# Patient Record
Sex: Female | Born: 1960 | Race: Black or African American | Hispanic: No | Marital: Single | Smoking: Never smoker
Health system: Southern US, Community
[De-identification: ages and names within clinical notes are randomized; demographics above are authoritative.]

## PROBLEM LIST (undated history)

## (undated) DIAGNOSIS — D573 Sickle-cell trait: Secondary | ICD-10-CM

## (undated) DIAGNOSIS — R42 Dizziness and giddiness: Secondary | ICD-10-CM

## (undated) DIAGNOSIS — Z972 Presence of dental prosthetic device (complete) (partial): Secondary | ICD-10-CM

## (undated) DIAGNOSIS — F32A Depression, unspecified: Secondary | ICD-10-CM

## (undated) DIAGNOSIS — M1990 Unspecified osteoarthritis, unspecified site: Secondary | ICD-10-CM

## (undated) DIAGNOSIS — J45909 Unspecified asthma, uncomplicated: Secondary | ICD-10-CM

## (undated) DIAGNOSIS — F329 Major depressive disorder, single episode, unspecified: Secondary | ICD-10-CM

## (undated) DIAGNOSIS — L409 Psoriasis, unspecified: Secondary | ICD-10-CM

## (undated) HISTORY — PX: ABDOMINAL HYSTERECTOMY: SHX81

## (undated) HISTORY — PX: COLONOSCOPY: SHX174

---

## 2008-03-15 ENCOUNTER — Ambulatory Visit: Payer: Self-pay

## 2008-04-11 ENCOUNTER — Emergency Department: Payer: Self-pay | Admitting: Emergency Medicine

## 2008-05-12 ENCOUNTER — Emergency Department: Payer: Self-pay | Admitting: Emergency Medicine

## 2008-08-01 ENCOUNTER — Emergency Department: Payer: Self-pay

## 2009-01-23 ENCOUNTER — Emergency Department: Payer: Self-pay | Admitting: Emergency Medicine

## 2009-05-03 ENCOUNTER — Inpatient Hospital Stay: Payer: Self-pay | Admitting: Psychiatry

## 2009-09-08 ENCOUNTER — Emergency Department: Payer: Self-pay | Admitting: Emergency Medicine

## 2009-10-10 ENCOUNTER — Emergency Department: Payer: Self-pay | Admitting: Emergency Medicine

## 2009-10-13 ENCOUNTER — Emergency Department: Payer: Self-pay | Admitting: Internal Medicine

## 2011-02-20 ENCOUNTER — Ambulatory Visit: Payer: Self-pay

## 2011-08-05 ENCOUNTER — Emergency Department: Payer: Self-pay | Admitting: *Deleted

## 2011-08-05 LAB — CBC
HCT: 35.2 % (ref 35.0–47.0)
HGB: 11.3 g/dL — ABNORMAL LOW (ref 12.0–16.0)
MCH: 25.9 pg — ABNORMAL LOW (ref 26.0–34.0)
Platelet: 158 10*3/uL (ref 150–440)
RBC: 4.37 10*6/uL (ref 3.80–5.20)
WBC: 5.1 10*3/uL (ref 3.6–11.0)

## 2011-08-05 LAB — URINALYSIS, COMPLETE
Bilirubin,UR: NEGATIVE
Glucose,UR: NEGATIVE mg/dL (ref 0–75)
Ketone: NEGATIVE
Nitrite: NEGATIVE
Protein: NEGATIVE
Specific Gravity: 1.015 (ref 1.003–1.030)
WBC UR: 11 /HPF (ref 0–5)

## 2011-08-05 LAB — LIPASE, BLOOD: Lipase: 155 U/L (ref 73–393)

## 2011-08-26 ENCOUNTER — Ambulatory Visit: Payer: Self-pay | Admitting: Internal Medicine

## 2011-08-26 LAB — RETICULOCYTES
Absolute Retic Count: 0.0876 10*6/uL — ABNORMAL HIGH (ref 0.024–0.084)
Reticulocyte: 1.97 % — ABNORMAL HIGH (ref 0.5–1.5)

## 2011-08-26 LAB — CBC CANCER CENTER
Bands: 1 %
Comment - H1-Com3: NORMAL
Eosinophil: 4 %
HCT: 35.5 % (ref 35.0–47.0)
MCH: 25.1 pg — ABNORMAL LOW (ref 26.0–34.0)
MCV: 80 fL (ref 80–100)
Monocytes: 7 %
Platelet: 196 x10 3/mm (ref 150–440)
RDW: 14.9 % — ABNORMAL HIGH (ref 11.5–14.5)
Segmented Neutrophils: 49 %
WBC: 3.9 x10 3/mm (ref 3.6–11.0)

## 2011-08-26 LAB — FERRITIN: Ferritin (ARMC): 7 ng/mL — ABNORMAL LOW (ref 8–388)

## 2011-08-26 LAB — IRON AND TIBC
Iron Bind.Cap.(Total): 353 ug/dL (ref 250–450)
Iron Saturation: 11 %
Iron: 39 ug/dL — ABNORMAL LOW (ref 50–170)
Unbound Iron-Bind.Cap.: 314 ug/dL

## 2011-08-26 LAB — LACTATE DEHYDROGENASE: LDH: 148 U/L (ref 84–246)

## 2011-08-26 LAB — FOLATE: Folic Acid: 6.9 ng/mL (ref 3.1–100.0)

## 2011-08-27 LAB — PROT IMMUNOELECTROPHORES(ARMC)

## 2011-09-10 LAB — OCCULT BLOOD X 1 CARD TO LAB, STOOL
Occult Blood, Feces: NEGATIVE
Occult Blood, Feces: NEGATIVE

## 2011-09-21 ENCOUNTER — Ambulatory Visit: Payer: Self-pay | Admitting: Internal Medicine

## 2011-11-04 ENCOUNTER — Ambulatory Visit: Payer: Self-pay | Admitting: Obstetrics and Gynecology

## 2011-11-04 LAB — COMPREHENSIVE METABOLIC PANEL
Albumin: 3.4 g/dL (ref 3.4–5.0)
Anion Gap: 8 (ref 7–16)
Co2: 25 mmol/L (ref 21–32)
Creatinine: 0.87 mg/dL (ref 0.60–1.30)
EGFR (African American): >60
Glucose: 93 mg/dL (ref 65–99)
Osmolality: 282 (ref 275–301)
Potassium: 3.8 mmol/L (ref 3.5–5.1)
Sodium: 142 mmol/L (ref 136–145)

## 2011-11-04 LAB — CBC
HGB: 11.1 g/dL — ABNORMAL LOW (ref 12.0–16.0)
MCHC: 32.6 g/dL (ref 32.0–36.0)
Platelet: 164 10*3/uL (ref 150–440)
RBC: 4.21 10*6/uL (ref 3.80–5.20)
RDW: 17.1 % — ABNORMAL HIGH (ref 11.5–14.5)

## 2011-11-11 ENCOUNTER — Ambulatory Visit: Payer: Self-pay | Admitting: Internal Medicine

## 2011-11-11 LAB — CBC CANCER CENTER
Basophil %: 1.1 %
Eosinophil #: 0.1 x10 3/mm (ref 0.0–0.7)
Eosinophil %: 1.9 %
HGB: 11.2 g/dL — ABNORMAL LOW (ref 12.0–16.0)
Lymphocyte %: 42.3 %
MCHC: 31.4 g/dL — ABNORMAL LOW (ref 32.0–36.0)
Monocyte #: 0.3 x10 3/mm (ref 0.2–0.9)
Neutrophil %: 46.9 %
Platelet: 148 x10 3/mm — ABNORMAL LOW (ref 150–440)
RBC: 4.38 10*6/uL (ref 3.80–5.20)
WBC: 4 x10 3/mm (ref 3.6–11.0)

## 2011-11-11 LAB — FERRITIN: Ferritin (ARMC): 11 ng/mL (ref 8–388)

## 2011-11-11 LAB — IRON AND TIBC
Iron Bind.Cap.(Total): 295 ug/dL (ref 250–450)
Iron Saturation: 21 %
Iron: 63 ug/dL (ref 50–170)

## 2011-11-15 ENCOUNTER — Ambulatory Visit: Payer: Self-pay | Admitting: Obstetrics and Gynecology

## 2011-11-15 LAB — PREGNANCY, URINE: Pregnancy Test, Urine: NEGATIVE m[IU]/mL

## 2011-11-16 LAB — HEMATOCRIT: HCT: 31.7 % — ABNORMAL LOW (ref 35.0–47.0)

## 2011-11-21 ENCOUNTER — Ambulatory Visit: Payer: Self-pay | Admitting: Internal Medicine

## 2012-01-26 IMAGING — CR DG CHEST 2V
1 series · 2 of 2 positions shown · non-contrast
Comparison: none

REASON FOR EXAM: chest pain
COMMENTS:

[Series 1: view not recorded · 0.17mm/px · 2 of 2 slices shown]
[im 1/2]
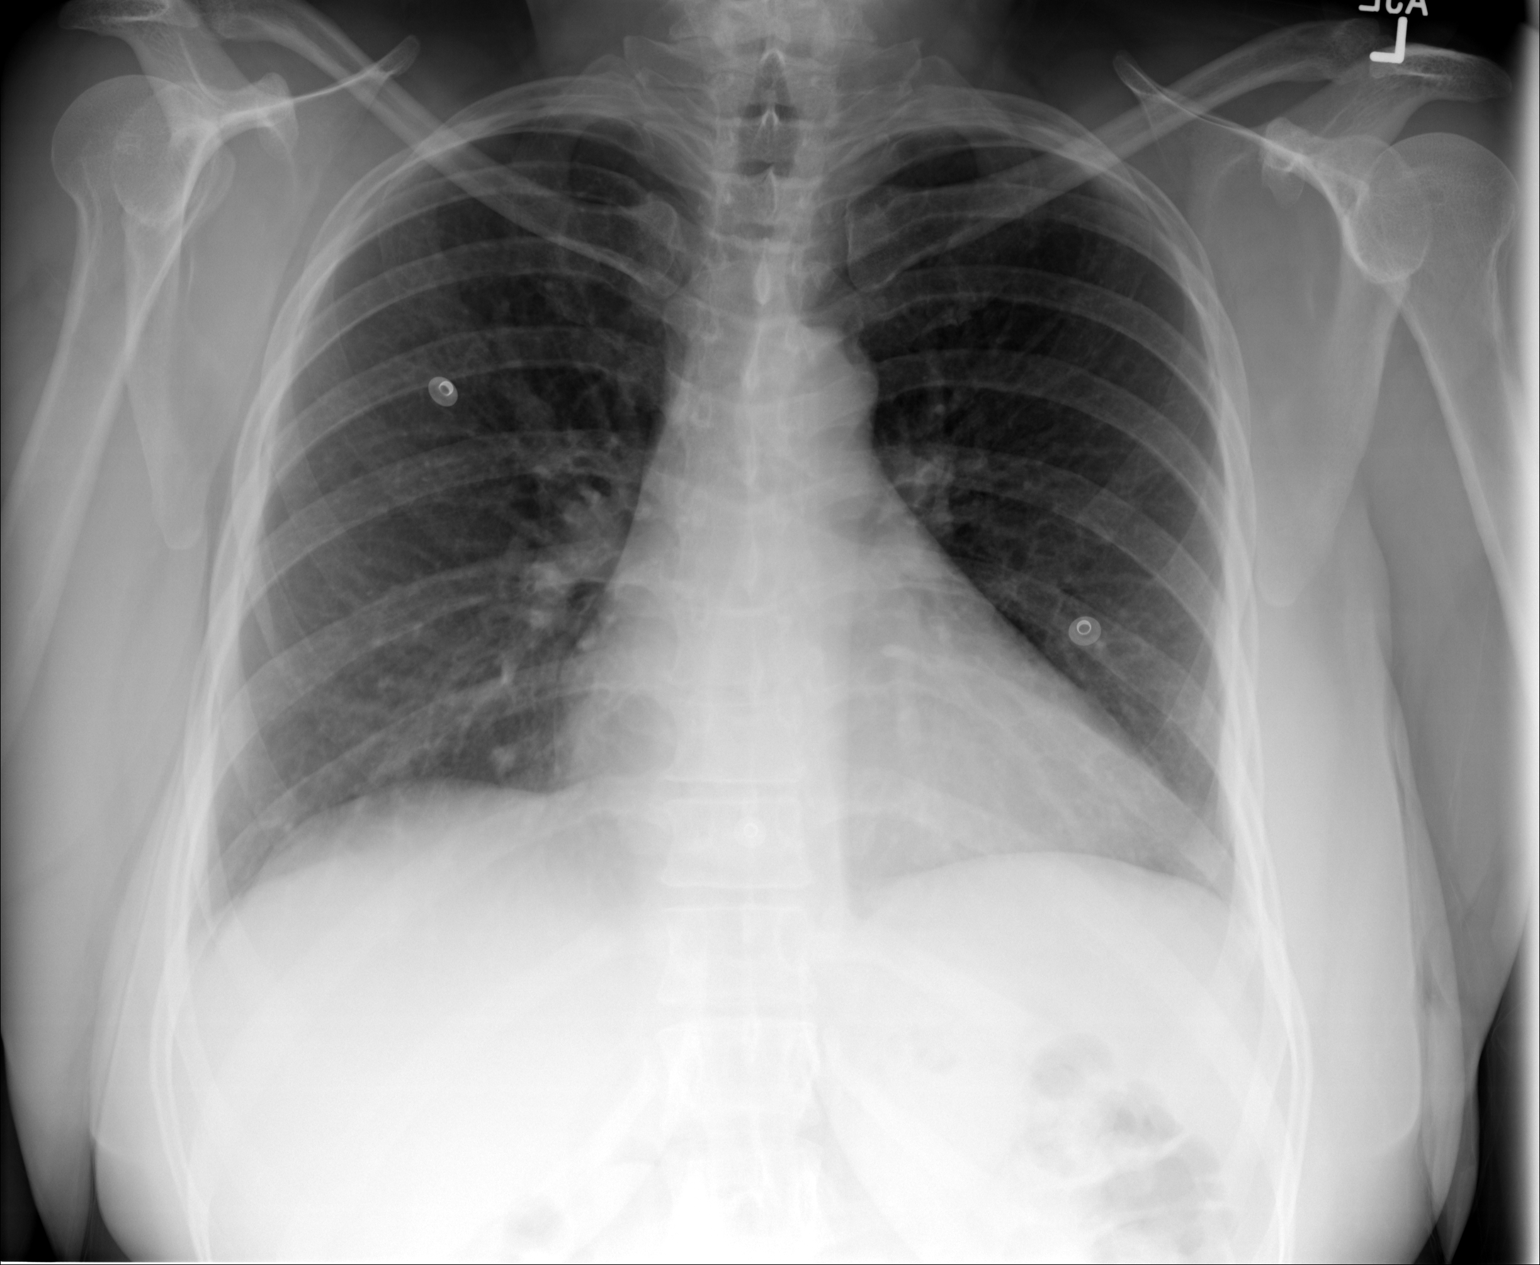
[im 2/2]
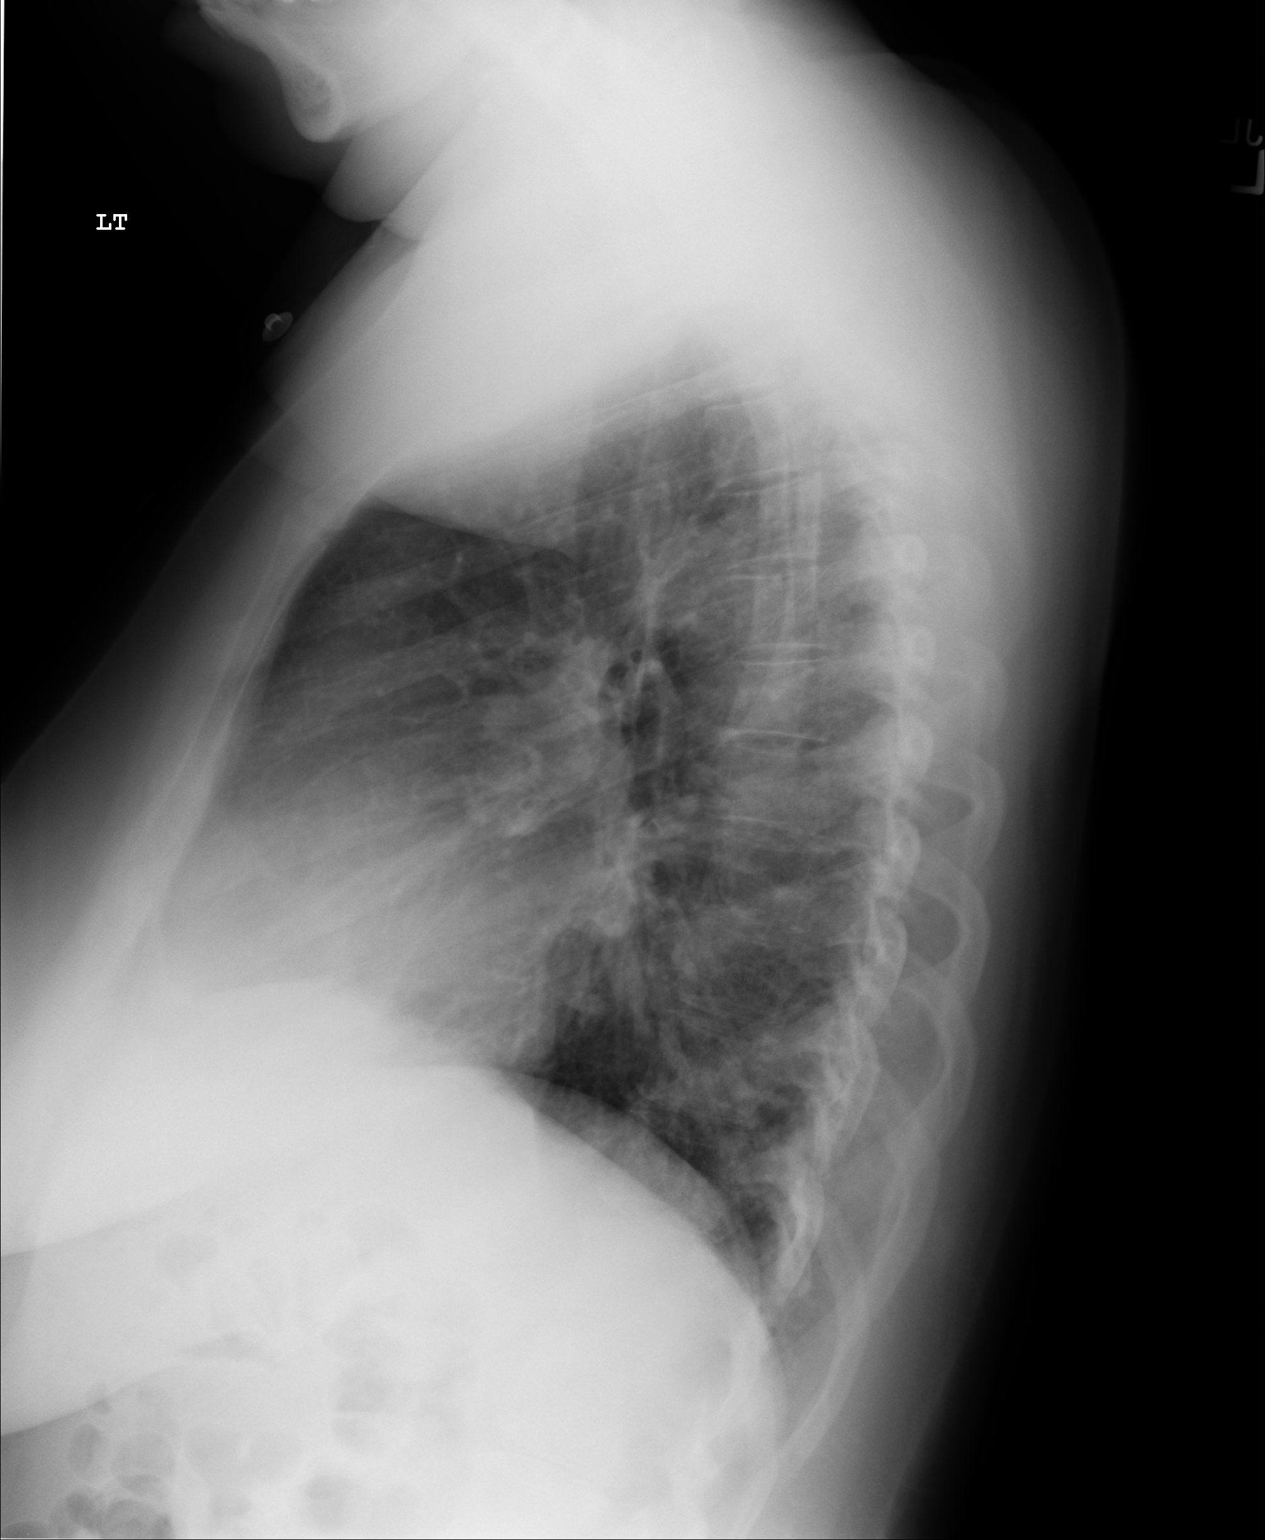

[2 of 2 positions shown; findings below may reference images not displayed]

PROCEDURE:     DXR - DXR CHEST PA (OR AP) AND LATERAL  - September 08, 2009  [DATE]

RESULT:     The lung fields are clear. No pneumonia, pneumothorax or pleural
effusion is seen. Allowing for differences in the inspiratory level, no
significant interval change seen as compared to the prior exam of 01/23/2009.
The mediastinal and structures show no significant abnormalities.
IMPRESSION: No acute changes are identified.

## 2012-02-07 ENCOUNTER — Ambulatory Visit: Payer: Self-pay | Admitting: Internal Medicine

## 2012-02-07 LAB — CBC CANCER CENTER
Basophil %: 0.8 %
Eosinophil %: 3.4 %
HCT: 36 % (ref 35.0–47.0)
HGB: 11.4 g/dL — ABNORMAL LOW (ref 12.0–16.0)
Lymphocyte #: 2 x10 3/mm (ref 1.0–3.6)
MCH: 25.9 pg — ABNORMAL LOW (ref 26.0–34.0)
MCHC: 31.8 g/dL — ABNORMAL LOW (ref 32.0–36.0)
MCV: 82 fL (ref 80–100)
Monocyte %: 8 %
Neutrophil #: 2.2 x10 3/mm (ref 1.4–6.5)
Neutrophil %: 46.5 %
Platelet: 158 x10 3/mm (ref 150–440)
RBC: 4.41 10*6/uL (ref 3.80–5.20)

## 2012-02-07 LAB — IRON AND TIBC
Iron Bind.Cap.(Total): 333 ug/dL (ref 250–450)
Iron Saturation: 25 %
Iron: 82 ug/dL (ref 50–170)
Unbound Iron-Bind.Cap.: 251 ug/dL

## 2012-02-07 LAB — FERRITIN: Ferritin (ARMC): 12 ng/mL

## 2012-02-21 ENCOUNTER — Ambulatory Visit: Payer: Self-pay | Admitting: Internal Medicine

## 2012-04-22 ENCOUNTER — Ambulatory Visit: Payer: Self-pay | Admitting: Internal Medicine

## 2012-07-11 ENCOUNTER — Emergency Department: Payer: Self-pay | Admitting: Emergency Medicine

## 2012-08-28 ENCOUNTER — Emergency Department: Payer: Self-pay | Admitting: Unknown Physician Specialty

## 2013-01-27 ENCOUNTER — Emergency Department: Payer: Self-pay | Admitting: Emergency Medicine

## 2013-01-27 LAB — URINALYSIS, COMPLETE
Bacteria: NONE SEEN
Bilirubin,UR: NEGATIVE
Glucose,UR: NEGATIVE mg/dL (ref 0–75)
Leukocyte Esterase: NEGATIVE
Nitrite: NEGATIVE
Protein: NEGATIVE
Specific Gravity: 1.017 (ref 1.003–1.030)
Squamous Epithelial: 4

## 2013-04-27 ENCOUNTER — Emergency Department: Payer: Self-pay | Admitting: Emergency Medicine

## 2013-04-27 LAB — URINALYSIS, COMPLETE
Bacteria: NONE SEEN
Bilirubin,UR: NEGATIVE
Glucose,UR: NEGATIVE mg/dL (ref 0–75)
KETONE: NEGATIVE
LEUKOCYTE ESTERASE: NEGATIVE
Nitrite: NEGATIVE
PH: 6 (ref 4.5–8.0)
PROTEIN: NEGATIVE
RBC,UR: 2 /HPF (ref 0–5)
Specific Gravity: 1.02 (ref 1.003–1.030)
WBC UR: <1 /HPF (ref 0–5)

## 2013-09-03 ENCOUNTER — Emergency Department: Payer: Self-pay | Admitting: Emergency Medicine

## 2013-09-03 LAB — CBC
HCT: 37.1 %
HGB: 12 g/dL
MCH: 26.3 pg
MCHC: 32.5 g/dL
MCV: 81 fL
Platelet: 174 x10 3/mm 3
RBC: 4.57 X10 6/mm 3
RDW: 13.7 %
WBC: 4.1 x10 3/mm 3

## 2013-09-03 LAB — BASIC METABOLIC PANEL
Anion Gap: 4 — ABNORMAL LOW (ref 7–16)
BUN: 9 mg/dL (ref 7–18)
CO2: 27 mmol/L (ref 21–32)
CREATININE: 1.2 mg/dL (ref 0.60–1.30)
Calcium, Total: 8.7 mg/dL (ref 8.5–10.1)
Chloride: 109 mmol/L — ABNORMAL HIGH (ref 98–107)
EGFR (African American): >60
GFR CALC NON AF AMER: 52 — AB
Glucose: 114 mg/dL — ABNORMAL HIGH (ref 65–99)
OSMOLALITY: 279 (ref 275–301)
Potassium: 3.8 mmol/L (ref 3.5–5.1)
Sodium: 140 mmol/L (ref 136–145)

## 2013-09-03 LAB — TROPONIN I: Troponin-I: <0.02 ng/mL

## 2013-09-06 ENCOUNTER — Emergency Department: Payer: Self-pay | Admitting: Emergency Medicine

## 2013-11-08 ENCOUNTER — Ambulatory Visit: Payer: Self-pay | Admitting: Pain Medicine

## 2013-11-08 LAB — BASIC METABOLIC PANEL
Anion Gap: 5 — ABNORMAL LOW (ref 7–16)
BUN: 10 mg/dL (ref 7–18)
CALCIUM: 8.7 mg/dL (ref 8.5–10.1)
CREATININE: 1.03 mg/dL (ref 0.60–1.30)
Chloride: 108 mmol/L — ABNORMAL HIGH (ref 98–107)
Co2: 27 mmol/L (ref 21–32)
GLUCOSE: 88 mg/dL (ref 65–99)
Osmolality: 278 (ref 275–301)
POTASSIUM: 4.2 mmol/L (ref 3.5–5.1)
SODIUM: 140 mmol/L (ref 136–145)

## 2013-11-08 LAB — HEPATIC FUNCTION PANEL A (ARMC)
ALBUMIN: 3.4 g/dL (ref 3.4–5.0)
Alkaline Phosphatase: 73 U/L
Bilirubin,Total: 0.2 mg/dL (ref 0.2–1.0)
SGOT(AST): 11 U/L — ABNORMAL LOW (ref 15–37)
SGPT (ALT): 19 U/L (ref 12–78)
TOTAL PROTEIN: 7.3 g/dL (ref 6.4–8.2)

## 2013-11-08 LAB — SEDIMENTATION RATE: Erythrocyte Sed Rate: 18 mm/hr (ref 0–30)

## 2013-11-08 LAB — MAGNESIUM: MAGNESIUM: 2 mg/dL

## 2013-11-19 ENCOUNTER — Ambulatory Visit: Payer: Self-pay | Admitting: Pain Medicine

## 2013-12-09 ENCOUNTER — Ambulatory Visit: Payer: Self-pay | Admitting: Pain Medicine

## 2014-01-10 ENCOUNTER — Ambulatory Visit: Payer: Self-pay | Admitting: Pain Medicine

## 2014-01-14 ENCOUNTER — Ambulatory Visit: Payer: Self-pay | Admitting: Unknown Physician Specialty

## 2014-01-14 HISTORY — PX: KNEE ARTHROSCOPY: SUR90

## 2014-01-24 ENCOUNTER — Ambulatory Visit: Payer: Self-pay | Admitting: Family Medicine

## 2014-01-26 ENCOUNTER — Ambulatory Visit: Payer: Self-pay | Admitting: Family Medicine

## 2014-02-07 ENCOUNTER — Ambulatory Visit: Payer: Self-pay | Admitting: Pain Medicine

## 2014-08-09 NOTE — Op Note (Signed)
PATIENT NAME:  Kathryn Pratt, Kathryn Pratt MR#:  [id number] DATE OF BIRTH:  09-03-1960  DATE OF PROCEDURE:  11/15/2011  PREOPERATIVE DIAGNOSES:  1. Abnormal uterine bleeding.  2. Leiomyoma.   POSTOPERATIVE DIAGNOSES:  1. Abnormal uterine bleeding.  2. Leiomyoma.   PROCEDURE:  1. Laparoscopic supracervical hysterectomy.  2. Bilateral salpingectomy.   SURGEON: Prentice Docker, MD   ASSISTANT: Barnett Applebaum, MD   ANESTHESIA: General.   ESTIMATED BLOOD LOSS: 100 mL.   OPERATIVE FLUIDS: 1800 mL crystalloid.   COMPLICATIONS: None.   FINDINGS:  1. Enlarged-appearing uterus.  2. Normal-appearing fallopian tubes and ovaries bilaterally.   SPECIMENS:  1. Uterus without cervix.  2. Right fallopian tube and left fallopian tube.   CONDITION: Stable.   INDICATIONS FOR PROCEDURE: Kathryn Pratt is a 54 year old female who presented to me with abnormal and heavy uterine bleeding. Evaluation revealed a fibroid uterus. After discussing the options of treatment, the patient elected to undergo a hysterectomy. She was therefore taken to the Operating Room.   PROCEDURE IN DETAIL: After patient was met in the preoperative area and her questions were answered, she was taken to the Operating Room. She was placed under general anesthesia which was found to be adequate. She was placed in the dorsal supine lithotomy position and prepped and draped in the usual sterile fashion. A sponge stick was placed in the vagina for uterine manipulation. Attention was then focused on the abdomen. After injection of local anesthetic, a 5 mm infraumbilical incision was made and the abdomen was entered via the Optiview trocar method, which is a direct entry visualization method, without incident. Entry into the abdomen was verified using opening pressure. The abdomen was insufflated with CO2. A camera was then introduced through the trocar and atraumatic entry was verified. An abdominal survey was undertaken with the above-noted findings.  A 44mm right lower quadrant port was placed under direct intra-abdominal observation. A 25mm left lower quadrant port was placed in the same manner.   Attention was turned to the right fallopian tube where the mesosalpinx was identified and transected using the Harmonic scalpel to the medial most point of the utero-ovarian ligament. From there the direction was changed to transect the utero-ovarian ligament and the round ligament. The broad ligament was then transected to the level of the internal os of the cervix, and the bladder flap was created and the bladder was moved out of the operative area of interest. The right uterine artery was skeletonized and then cauterized using bipolar electrocautery. The right uterine artery was then transected using the Harmonic. In the same fashion, the left fallopian tube and utero-ovarian ligament, round ligament, and broad ligament were transected and dissected until the left uterine artery was skeletonized, identified, cauterized with bipolar electrocautery and transected using the Harmonic scalpel. The bladder flap was created. The uterus at the level of the internal cervical os was well exposed without overlying bladder. This was then transected using a Harmonic scalpel until the uterus had been liberated from the cervix. Hemostasis was obtained in the cervix. The endocervix was then burned using the bipolar electro-cautery Kleppinger tool.   The right lower quadrant port was then extended to a 15 mm port, and the Gynecare morcellator was then introduced through that port. Under direct camera visualization, the uterus was morcellated until it was entirely removed from the abdominal cavity. The abdomen was thoroughly inspected for any remaining parts of uterus that may have then created during the morcellation process. All pieces that were found were removed.  Attention was then turned to the vascular pedicles of the hysterectomy which remained hemostatic. An adhesion  barrier was placed over the cervical stump. Under direct camera visualization, the fascia was closed using a fascial closure device on the right lower quadrant 15 mm port which occurred without incident. The abdomen was desufflated of CO2. Each incision was closed using 4-0 Monocryl in a subcuticular fashion and covered with Dermabond layer of the skin.   The patient tolerated the procedure well. Sponge, lap, and needle counts were correct x2. The patient received preoperative antibiotics in the form of Ancef 2 grams prior to skin incision. For VTE prophylaxis, she had pneumatic compression stockings in place and operating throughout the entire procedure. Additionally for VTE prophylaxis, she received 5000 units subcutaneously of heparin prior to skin incision. She was awakened in the Operating Room and transported to the recovery area in stable condition.   ____________________________ Will Bonnet, MD sdj:cbb D: 11/15/2011 14:38:02 ET T: 11/15/2011 15:06:07 ET JOB#: [id number]  cc: Will Bonnet, MD, <Dictator> Will Bonnet MD ELECTRONICALLY SIGNED 11/21/2011 72:07

## 2014-09-14 ENCOUNTER — Encounter: Payer: Self-pay | Admitting: *Deleted

## 2014-09-23 ENCOUNTER — Encounter
Admission: RE | Disposition: A | Discharge status: Home / Self Care | Payer: Self-pay | Source: Ambulatory Visit | Attending: Unknown Physician Specialty

## 2014-09-23 ENCOUNTER — Ambulatory Visit: Payer: Medicare Other | Admitting: Anesthesiology

## 2014-09-23 ENCOUNTER — Ambulatory Visit
Admission: RE | Admit: 2014-09-23 | Discharge: 2014-09-23 | Disposition: A | Discharge status: Home / Self Care | Payer: Medicare Other | Source: Ambulatory Visit | Attending: Unknown Physician Specialty | Admitting: Unknown Physician Specialty

## 2014-09-23 DIAGNOSIS — F329 Major depressive disorder, single episode, unspecified: Secondary | ICD-10-CM | POA: Diagnosis not present

## 2014-09-23 DIAGNOSIS — L409 Psoriasis, unspecified: Secondary | ICD-10-CM | POA: Insufficient documentation

## 2014-09-23 DIAGNOSIS — S83281A Other tear of lateral meniscus, current injury, right knee, initial encounter: Secondary | ICD-10-CM | POA: Insufficient documentation

## 2014-09-23 DIAGNOSIS — Z79891 Long term (current) use of opiate analgesic: Secondary | ICD-10-CM | POA: Insufficient documentation

## 2014-09-23 DIAGNOSIS — X58XXXA Exposure to other specified factors, initial encounter: Secondary | ICD-10-CM | POA: Diagnosis not present

## 2014-09-23 DIAGNOSIS — J45909 Unspecified asthma, uncomplicated: Secondary | ICD-10-CM | POA: Insufficient documentation

## 2014-09-23 DIAGNOSIS — Z79899 Other long term (current) drug therapy: Secondary | ICD-10-CM | POA: Insufficient documentation

## 2014-09-23 HISTORY — DX: Unspecified asthma, uncomplicated: J45.909

## 2014-09-23 HISTORY — DX: Presence of dental prosthetic device (complete) (partial): Z97.2

## 2014-09-23 HISTORY — DX: Dizziness and giddiness: R42

## 2014-09-23 HISTORY — DX: Major depressive disorder, single episode, unspecified: F32.9

## 2014-09-23 HISTORY — DX: Psoriasis, unspecified: L40.9

## 2014-09-23 HISTORY — PX: KNEE ARTHROSCOPY WITH LATERAL MENISECTOMY: SHX6193

## 2014-09-23 HISTORY — DX: Depression, unspecified: F32.A

## 2014-09-23 HISTORY — DX: Unspecified osteoarthritis, unspecified site: M19.90

## 2014-09-23 HISTORY — DX: Sickle-cell trait: D57.3

## 2014-09-23 SURGERY — ARTHROSCOPY, KNEE, WITH LATERAL MENISCECTOMY
Anesthesia: General | Laterality: Right | Wound class: Clean

## 2014-09-23 MED ORDER — OXYCODONE-ACETAMINOPHEN 5-325 MG PO TABS: 1.0000 | ORAL_TABLET | Freq: Four times a day (QID) | ORAL | Status: DC | PRN

## 2014-09-23 MED ORDER — ONDANSETRON HCL 4 MG/2ML IJ SOLN
INTRAMUSCULAR | Status: DC | PRN
  Administered 2014-09-23: 4 mg via INTRAVENOUS

## 2014-09-23 MED ORDER — OXYCODONE HCL 5 MG PO TABS
5.0000 mg | ORAL_TABLET | Freq: Once | ORAL | Status: AC | PRN
  Administered 2014-09-23: 5 mg via ORAL

## 2014-09-23 MED ORDER — PROPOFOL 10 MG/ML IV BOLUS
INTRAVENOUS | Status: DC | PRN
  Administered 2014-09-23: 150 mg via INTRAVENOUS

## 2014-09-23 MED ORDER — MIDAZOLAM HCL 5 MG/5ML IJ SOLN
INTRAMUSCULAR | Status: DC | PRN
  Administered 2014-09-23: 2 mg via INTRAVENOUS

## 2014-09-23 MED ORDER — ONDANSETRON HCL 4 MG/2ML IJ SOLN: 4.0000 mg | Freq: Once | INTRAMUSCULAR | Status: DC | PRN

## 2014-09-23 MED ORDER — DEXAMETHASONE SODIUM PHOSPHATE 4 MG/ML IJ SOLN
INTRAMUSCULAR | Status: DC | PRN
  Administered 2014-09-23: 4 mg via INTRAVENOUS

## 2014-09-23 MED ORDER — KETOROLAC TROMETHAMINE 30 MG/ML IJ SOLN
INTRAMUSCULAR | Status: DC | PRN
  Administered 2014-09-23: 30 mg via INTRAVENOUS

## 2014-09-23 MED ORDER — ACETAMINOPHEN 160 MG/5ML PO SOLN: 325.0000 mg | ORAL | Status: DC | PRN

## 2014-09-23 MED ORDER — LACTATED RINGERS IR SOLN
Status: DC | PRN
  Administered 2014-09-23: 3000 mL

## 2014-09-23 MED ORDER — OXYCODONE HCL 5 MG/5ML PO SOLN: 5.0000 mg | Freq: Once | ORAL | Status: AC | PRN

## 2014-09-23 MED ORDER — HYDROCODONE-ACETAMINOPHEN 7.5-325 MG PO TABS: 1.0000 | ORAL_TABLET | Freq: Once | ORAL | Status: DC | PRN

## 2014-09-23 MED ORDER — LACTATED RINGERS IV SOLN
INTRAVENOUS | Status: DC
  Administered 2014-09-23 (×3): via INTRAVENOUS

## 2014-09-23 MED ORDER — FENTANYL CITRATE (PF) 100 MCG/2ML IJ SOLN
25.0000 ug | INTRAMUSCULAR | Status: AC | PRN
  Administered 2014-09-23 (×4): 25 ug via INTRAVENOUS

## 2014-09-23 MED ORDER — LIDOCAINE HCL (CARDIAC) 20 MG/ML IV SOLN
INTRAVENOUS | Status: DC | PRN
  Administered 2014-09-23: 50 mg via INTRATRACHEAL

## 2014-09-23 MED ORDER — BUPIVACAINE HCL (PF) 0.5 % IJ SOLN
INTRAMUSCULAR | Status: DC | PRN
  Administered 2014-09-23: 20 mL

## 2014-09-23 MED ORDER — GLYCOPYRROLATE 0.2 MG/ML IJ SOLN
INTRAMUSCULAR | Status: DC | PRN
  Administered 2014-09-23: 0.2 mg via INTRAVENOUS

## 2014-09-23 MED ORDER — ACETAMINOPHEN 325 MG PO TABS: 325.0000 mg | ORAL_TABLET | ORAL | Status: DC | PRN

## 2014-09-23 MED ORDER — FENTANYL CITRATE (PF) 100 MCG/2ML IJ SOLN
INTRAMUSCULAR | Status: DC | PRN
  Administered 2014-09-23 (×4): 25 ug via INTRAVENOUS
  Administered 2014-09-23: 50 ug via INTRAVENOUS
  Administered 2014-09-23 (×2): 25 ug via INTRAVENOUS

## 2014-09-23 SURGICAL SUPPLY — 36 items
ARTHROWAND PARAGON T2 (SURGICAL WAND) ×3
BUR RADIUS 3.5 (BURR) IMPLANT
BUR RADIUS 4.0X18.5 (BURR) ×3 IMPLANT
BUR ROUND 5.5 (BURR) IMPLANT
BURR ROUND 12 FLUTE 4.0MM (BURR) ×3 IMPLANT
CUFF TOURN SGL QUICK 24 (TOURNIQUET CUFF)
CUFF TOURN SGL QUICK 30 (MISCELLANEOUS)
CUFF TOURN SGL QUICK 34 (TOURNIQUET CUFF) ×2
CUFF TRNQT CYL 24X4X40X1 (TOURNIQUET CUFF) IMPLANT
CUFF TRNQT CYL 34X4X40X1 (TOURNIQUET CUFF) ×1 IMPLANT
CUFF TRNQT CYL LO 30X4X (MISCELLANEOUS) IMPLANT
CUTTER SLOTTED WHISKER 4.0 (BURR) ×3 IMPLANT
DRAPE LEGGINS SURG 28X43 STRL (DRAPES) ×3 IMPLANT
DURAPREP 26ML APPLICATOR (WOUND CARE) ×3 IMPLANT
GAUZE SPONGE 4X4 12PLY STRL (GAUZE/BANDAGES/DRESSINGS) ×3 IMPLANT
GLOVE BIO SURGEON STRL SZ7.5 (GLOVE) ×6 IMPLANT
GLOVE BIO SURGEON STRL SZ8 (GLOVE) ×3 IMPLANT
GLOVE INDICATOR 8.0 STRL GRN (GLOVE) ×6 IMPLANT
GOWN STRL REIN 2XL XLG LVL4 (GOWN DISPOSABLE) ×3 IMPLANT
GOWN STRL REUS W/TWL 2XL LVL3 (GOWN DISPOSABLE) ×3 IMPLANT
IV LACTATED RINGER IRRG 3000ML (IV SOLUTION) ×4
IV LR IRRIG 3000ML ARTHROMATIC (IV SOLUTION) ×2 IMPLANT
MANIFOLD 4PT FOR NEPTUNE1 (MISCELLANEOUS) ×3 IMPLANT
PACK ARTHROSCOPY KNEE (MISCELLANEOUS) ×3 IMPLANT
SET TUBE SUCT SHAVER OUTFL 24K (TUBING) ×3 IMPLANT
SUT ETHILON 3-0 FS-10 30 BLK (SUTURE) ×3
SUTURE EHLN 3-0 FS-10 30 BLK (SUTURE) ×1 IMPLANT
TAPE MICROFOAM 4IN (TAPE) ×3 IMPLANT
TUBING ARTHRO INFLOW-ONLY STRL (TUBING) ×3 IMPLANT
WAND ARTHRO PARAGON T2 (SURGICAL WAND) ×1 IMPLANT
WAND COVAC 50 IFS (MISCELLANEOUS) IMPLANT
WAND HAND CNTRL MULTIVAC 50 (MISCELLANEOUS) ×3 IMPLANT
WAND HAND CNTRL MULTIVAC 90 (MISCELLANEOUS) IMPLANT
WAND MEGAVAC 90 (MISCELLANEOUS) IMPLANT
WAND ULTRAVAC 90 (MISCELLANEOUS) IMPLANT
WRAP KNEE W/COLD PACKS 25.5X14 (SOFTGOODS) ×3 IMPLANT

## 2014-09-23 NOTE — Anesthesia Procedure Notes (Signed)
Procedure Name: LMA Insertion Performed by: Keiona Jenison Pre-anesthesia Checklist: Patient identified, Emergency Drugs available, Suction available, Patient being monitored and Timeout performed Patient Re-evaluated:Patient Re-evaluated prior to inductionOxygen Delivery Method: Circle system utilized Preoxygenation: Pre-oxygenation with 100% oxygen Intubation Type: IV induction LMA: LMA inserted LMA Size: 4.0 Number of attempts: 1     

## 2014-09-23 NOTE — Discharge Instructions (Signed)
General Anesthesia, Care After °Refer to this sheet in the next few weeks. These instructions provide you with information on caring for yourself after your procedure. Your health care provider may also give you more specific instructions. Your treatment has been planned according to current medical practices, but problems sometimes occur. Call your health care provider if you have any problems or questions after your procedure. °WHAT TO EXPECT AFTER THE PROCEDURE °After the procedure, it is typical to experience: °· Sleepiness. °· Nausea and vomiting. °HOME CARE INSTRUCTIONS °· For the first 24 hours after general anesthesia: °¨ Have a responsible person with you. °¨ Do not drive a car. If you are alone, do not take public transportation. °¨ Do not drink alcohol. °¨ Do not take medicine that has not been prescribed by your health care provider. °¨ Do not sign important papers or make important decisions. °¨ You may resume a normal diet and activities as directed by your health care provider. °· Change bandages (dressings) as directed. °· If you have questions or problems that seem related to general anesthesia, call the hospital and ask for the anesthetist or anesthesiologist on call. °SEEK MEDICAL CARE IF: °· You have nausea and vomiting that continue the day after anesthesia. °· You develop a rash. °SEEK IMMEDIATE MEDICAL CARE IF:  °· You have difficulty breathing. °· You have chest pain. °· You have any allergic problems. °Document Released: 07/15/2000 Document Revised: 04/13/2013 Document Reviewed: 10/22/2012 °[employer]® Patient Information ©2015 [employer]. This information is not intended to replace advice given to you by your health care provider. Make sure you discuss any questions you have with your health care provider. ° ° °[facility]  °Harold B. [facility], Jr., M.D. [phone]  ° °KNEE ARTHROSCOPY POST OPERATION INSTRUCTIONS: ° °PLEASE READ THESE INSTRUCTIONS  ABOUT POST OPERATION CARE. THEY WILL ANSWER MOST OF YOUR QUESTIONS.  °You have been given a prescription for pain. Please take as directed for pain.  °You can walk, keeping the knee slightly stiff-avoid doing too much bending the first day. (if ACL reconstruction is performed, keep brace locked in extension when walking.)  °You will use crutches or cane if needed. Can weight bear as tolerated  °Plan to take three to four days off from work. You can resume work when you are comfortable. (This can be a week or more, depending on the type of work you do.)  °To reduce pain and swelling, place one to two pillows under the knee the first two or three days when sitting or lying. An ice pack may be placed on top of the area over the dressing. Instructions for making homemade icepack are as follow:  °Flexible homemade alcohol water ice pack  °2 cups water  °1 cup rubbing alcohol  °food coloring for the blue tint (optional)  °2 zip-top bags - gallon-size  °Mix the water and alcohol together in one of your zip-top bags and add food coloring. Release as much air as possible and seal the bag. Place in freezer for at least 12 hours.  °The small incisions in your knee are closed with nylon stitches. They will be removed in the office.  °The bulky dressing may be removed in the third day after surgery. (If ACL surgery-DO NOT REMOVE BANDAGES). Put a waterproof band-aid over each stitch. Do not put any creams or ointments on wounds. You may shower at this time, but change waterproof band-aids after showering. KEEP INCISIONS CLEAN AND DRY UNTIL YOU RETURN TO   THE OFFICE.  °Sometimes the operative area remains somewhat painful and swollen for several weeks. This is usually nothing to worry about, but call if you have any excessive symptoms, especially fever. It is not unusual to have a low grade fever of 99 degrees for the first few days. If persist after 3-4 days call the office. It is not uncommon for the pain to be a little worse on  the third day after surgery.  °Begin doing gentle exercises right away. They will be limited by the amount of pain and swelling you have.  Exercising will reduce the swelling, increase motion, and prevent muscle weakness. Exercises: Straight leg raising and gentle knee bending.  °Take 81 milligram aspirin twice a day for 2 weeks after meals or milk. This along with elevation will help reduce the possibility of phlebitis in your operated leg.  °Avoid strenuous athletics for a minimum of 4 to 6 weeks after arthroscopic surgery (approximately five months if ACL surgery).  °If the surgery included ACL reconstruction the brace that is supplied to the extremity post surgery is to be locked in extension when you are asleep and is to be locked in extension when you are ambulating. It can be unlocked for exercises or sitting.  °Keep your post surgery appointment that has been made for you. If you do not remember the date call [phone]. Your follow up appointment should be between 7-10 days.  ° °

## 2014-09-23 NOTE — Transfer of Care (Signed)
Immediate Anesthesia Transfer of Care Note  Patient: Kathryn Pratt  Procedure(s) Performed: Procedure(s) with comments: KNEE ARTHROSCOPY WITH LATERAL MENISECTOMY (Right) - lateral chondral debridement  Patient Location: PACU  Anesthesia Type: General LMA  Level of Consciousness: awake, alert  and patient cooperative  Airway and Oxygen Therapy: Patient Spontanous Breathing and Patient connected to supplemental oxygen  Post-op Assessment: Post-op Vital signs reviewed, Patient's Cardiovascular Status Stable, Respiratory Function Stable, Patent Airway and No signs of Nausea or vomiting  Post-op Vital Signs: Reviewed and stable  Complications: No apparent anesthesia complications

## 2014-09-23 NOTE — H&P (Signed)
  H and P reviewed. No changes. Uploaded at later date. 

## 2014-09-23 NOTE — Anesthesia Postprocedure Evaluation (Signed)
  Anesthesia Post-op Note  Patient: Kathryn Pratt  Procedure(s) Performed: Procedure(s) with comments: KNEE ARTHROSCOPY WITH LATERAL MENISECTOMY (Right) - lateral chondral debridement  Anesthesia type:General LMA  Patient location: PACU  Post pain: Pain level controlled  Post assessment: Post-op Vital signs reviewed, Patient's Cardiovascular Status Stable, Respiratory Function Stable, Patent Airway and No signs of Nausea or vomiting  Post vital signs: Reviewed and stable  Last Vitals:  Filed Vitals:   09/23/14 0939  BP: 135/92  Pulse: 62  Temp: 36.6 C  Resp: 16    Level of consciousness: awake, alert  and patient cooperative  Complications: No apparent anesthesia complications

## 2014-09-23 NOTE — Anesthesia Preprocedure Evaluation (Signed)
Anesthesia Evaluation  Patient identified by MRN, date of birth, ID band  Reviewed: Allergy & Precautions, H&P , NPO status , Patient's Chart, lab work & pertinent test results  Airway Mallampati: I  TM Distance: >3 FB Neck ROM: full    Dental  (+) Missing, Chipped   Pulmonary asthma ,    Pulmonary exam normal       Cardiovascular Rhythm:regular Rate:Normal     Neuro/Psych PSYCHIATRIC DISORDERS    GI/Hepatic   Endo/Other    Renal/GU      Musculoskeletal   Abdominal   Peds  Hematology   Anesthesia Other Findings   Reproductive/Obstetrics                             Anesthesia Physical Anesthesia Plan  ASA: II  Anesthesia Plan: General LMA   Post-op Pain Management:    Induction:   Airway Management Planned:   Additional Equipment:   Intra-op Plan:   Post-operative Plan:   Informed Consent: I have reviewed the patients History and Physical, chart, labs and discussed the procedure including the risks, benefits and alternatives for the proposed anesthesia with the patient or authorized representative who has indicated his/her understanding and acceptance.     Plan Discussed with: CRNA  Anesthesia Plan Comments:         Anesthesia Quick Evaluation

## 2014-09-23 NOTE — Op Note (Signed)
Preoperative diagnosis: Torn lateral meniscus right knee plus lateral compartment chondral lesions  Postop diagnosis: same  Operation: arthroscopic partial lateral meniscectomy plus lateral compartment chondral debridement  Surgeon: Randon GoldsmithKERNODLE,Thedore Pickel B, MD  Anesthesia: Gen.   History: Patient's had a long history of right knee pain.  Recently the patient had been having increasing right knee discomfort despite a previous arthroscopic partial lateral meniscectomy and microfracture of the medial femoral chondral lesion Her plain films revealed some mild lateral compartment degenerative changes.  The patient was scheduled for surgery due to persistent discomfort despite conservative treatment.  The patient was taken the operating room where satisfactory general anesthesia was achieved. A tourniquet and leg holder were was applied to the right thigh. The right lower extremity was supported with a well leg holder. The right knee was prepped and draped in usual fashion for an arthroscopic procedure. An inflow cannula was introduced superomedially. The joint was distended with lactated Ringer's. Scope was introduced through an inferolateral puncture wound and a probe through an inferomedial puncture wound. Inspection of the medial compartment revealed  a well healed medial femoral chondral lesion. This was the lesion that had been previously microfractured. Inspection of the intercondylar notch revealed normal cruciates. Inspection of the the lateral compartment revealed an anterior horn tear of the lateral meniscus plus grade 3-4 chondral changes in the lateral compartment. I performed a partial lateral meniscectomy and then debrided the grade 3 lateral femoral chondral lesion and grade 3 lateral tibial plateau chondral lesion with a turbo whisker. I then coblated the lesions with a [employer] ArthroCare wand. I then used a small wound bur to perform a chondroplasty on the small lateral tibial plateau grade 4  lesion. Trochlear groove was inspected and appeared to be fairly smooth.  Patella surface was only mildly frayed.  The instruments were removed from the joint at this time. The puncture wounds were closed with 3-0 nylon in vertical mattress fashion. I injected each puncture wound with several cc of half percent Marcaine without epinephrine. Betadine was applied the wounds followed by sterile dressing. An ice pack was applied to the right knee. The patient was awakened and transferred to her stretcher bed. She was taken to recovery room satisfactory condition  The tourniquet was not inflated during the course of the procedure. Blood loss was negligible.

## 2014-09-26 ENCOUNTER — Encounter: Payer: Self-pay | Admitting: Unknown Physician Specialty

## 2014-11-24 ENCOUNTER — Other Ambulatory Visit: Payer: Self-pay | Admitting: Unknown Physician Specialty

## 2014-11-24 DIAGNOSIS — M1711 Unilateral primary osteoarthritis, right knee: Secondary | ICD-10-CM

## 2014-11-28 ENCOUNTER — Ambulatory Visit
Admission: RE | Admit: 2014-11-28 | Discharge: 2014-11-28 | Disposition: A | Discharge status: Home / Self Care | Payer: Medicare Other | Source: Ambulatory Visit | Attending: Unknown Physician Specialty | Admitting: Unknown Physician Specialty

## 2014-11-28 DIAGNOSIS — M1711 Unilateral primary osteoarthritis, right knee: Secondary | ICD-10-CM | POA: Diagnosis present

## 2015-09-01 ENCOUNTER — Other Ambulatory Visit: Payer: Self-pay | Admitting: Family Medicine

## 2015-09-01 DIAGNOSIS — Z1231 Encounter for screening mammogram for malignant neoplasm of breast: Secondary | ICD-10-CM

## 2015-09-04 ENCOUNTER — Other Ambulatory Visit: Payer: Self-pay | Admitting: Unknown Physician Specialty

## 2015-09-04 DIAGNOSIS — M1712 Unilateral primary osteoarthritis, left knee: Secondary | ICD-10-CM

## 2015-09-08 ENCOUNTER — Ambulatory Visit
Admission: RE | Admit: 2015-09-08 | Discharge: 2015-09-08 | Disposition: A | Discharge status: Home / Self Care | Payer: Medicare Other | Source: Ambulatory Visit | Attending: Family Medicine | Admitting: Family Medicine

## 2015-09-08 ENCOUNTER — Other Ambulatory Visit: Payer: Self-pay | Admitting: Family Medicine

## 2015-09-08 DIAGNOSIS — Z1231 Encounter for screening mammogram for malignant neoplasm of breast: Secondary | ICD-10-CM

## 2015-09-22 ENCOUNTER — Ambulatory Visit
Admission: RE | Admit: 2015-09-22 | Discharge: 2015-09-22 | Disposition: A | Discharge status: Home / Self Care | Payer: Medicare Other | Source: Ambulatory Visit | Attending: Unknown Physician Specialty | Admitting: Unknown Physician Specialty

## 2015-09-22 DIAGNOSIS — S83242A Other tear of medial meniscus, current injury, left knee, initial encounter: Secondary | ICD-10-CM | POA: Diagnosis not present

## 2015-09-22 DIAGNOSIS — M25562 Pain in left knee: Secondary | ICD-10-CM | POA: Diagnosis not present

## 2015-09-22 DIAGNOSIS — M1712 Unilateral primary osteoarthritis, left knee: Secondary | ICD-10-CM

## 2015-09-22 DIAGNOSIS — M949 Disorder of cartilage, unspecified: Secondary | ICD-10-CM | POA: Diagnosis not present

## 2016-03-11 ENCOUNTER — Ambulatory Visit (INDEPENDENT_AMBULATORY_CARE_PROVIDER_SITE_OTHER): Payer: Medicare Other | Admitting: Vascular Surgery

## 2016-03-11 ENCOUNTER — Encounter (INDEPENDENT_AMBULATORY_CARE_PROVIDER_SITE_OTHER): Payer: Self-pay | Admitting: Vascular Surgery

## 2016-03-11 DIAGNOSIS — I890 Lymphedema, not elsewhere classified: Secondary | ICD-10-CM | POA: Diagnosis not present

## 2016-03-11 DIAGNOSIS — I872 Venous insufficiency (chronic) (peripheral): Secondary | ICD-10-CM

## 2016-03-11 DIAGNOSIS — M170 Bilateral primary osteoarthritis of knee: Secondary | ICD-10-CM

## 2016-03-11 DIAGNOSIS — M79609 Pain in unspecified limb: Secondary | ICD-10-CM | POA: Insufficient documentation

## 2016-03-11 DIAGNOSIS — M79605 Pain in left leg: Secondary | ICD-10-CM | POA: Diagnosis not present

## 2016-03-11 DIAGNOSIS — M1710 Unilateral primary osteoarthritis, unspecified knee: Secondary | ICD-10-CM | POA: Insufficient documentation

## 2016-03-11 DIAGNOSIS — M179 Osteoarthritis of knee, unspecified: Secondary | ICD-10-CM | POA: Insufficient documentation

## 2016-03-11 NOTE — Progress Notes (Signed)
MRN : [id number]  Kathryn Pratt is a 55 y.o. (08/27/1960) female who presents with chief complaint of  Chief Complaint  Patient presents with  . New Patient (Initial Visit)  .  History of Present Illness: Patient is seen for evaluation of leg swelling. The patient first noticed the swelling remotely but is now concerned because of a significant increase in the overall edema.  She notes the left leg has been much worse since her TKR on 01/01/2016.  Right knee had a Makoplasty partial a year ago. The swelling is associated with pain and discoloration. The patient notes that in the morning the legs are significantly improved but they steadily worsened throughout the course of the day. Elevation makes the legs better, dependency makes them much worse.   There is no history of ulcerations associated with the swelling.   The patient denies any recent changes in their medications.  The patient has not been wearing graduated compression.  The patient has no had any past angiography, interventions or vascular surgery.  The patient denies a history of DVT or PE. There is no prior history of phlebitis. There is no history of primary lymphedema.  There is no history of radiation treatment to the groin or pelvis No history of malignancies. No history of trauma or groin or pelvic surgery. No history of foreign travel or parasitic infections area    Current Meds  Medication Sig  . buPROPion (WELLBUTRIN XL) 300 MG 24 hr tablet Take 300 mg by mouth daily. AM  . carbamazepine (TEGRETOL) 200 MG tablet Take 200 mg by mouth at bedtime.  Marland Kitchen. oxycodone (OXY-IR) 5 MG capsule Take 5 mg by mouth every 8 (eight) hours as needed.  . ziprasidone (GEODON) 60 MG capsule Take 60 mg by mouth at bedtime.  Marland Kitchen. zolpidem (AMBIEN) 5 MG tablet Take 5 mg by mouth at bedtime as needed for sleep.    Past Medical History:  Diagnosis Date  . Arthritis    knee  . Asthma    no inhalers  . Depression   . Psoriasis   .  Sickle cell trait (HCC)   . Vertigo    none in 2 yrs  . Wears partial dentures    upper and lower    Past Surgical History:  Procedure Laterality Date  . ABDOMINAL HYSTERECTOMY    . COLONOSCOPY    . KNEE ARTHROSCOPY Right 01/14/14   [facility]  . KNEE ARTHROSCOPY WITH LATERAL MENISECTOMY Right 09/23/2014   Procedure: KNEE ARTHROSCOPY WITH LATERAL MENISECTOMY;  Surgeon: Erin SonsHarold Kernodle, MD;  [facility];  Service: Orthopedics;  Laterality: Right;  lateral chondral debridement    Social History Social History  Substance Use Topics  . Smoking status: Never Smoker  . Smokeless tobacco: Never Used  . Alcohol use No    Family History Family History  Problem Relation Age of Onset  . Congestive Heart Failure Mother   . Hypertension Father   . Stroke Paternal Uncle   . Breast cancer Neg Hx   No family history of bleeding/clotting disorders, porphyria or autoimmune disease   Allergies  Allergen Reactions  . Adhesive [Tape] Other (See Comments)    Some bandaids cause bruising  . Codeine Sulfate Hives     REVIEW OF SYSTEMS (Negative unless checked)  Constitutional: [] Weight loss  [] Fever  [] Chills Cardiac: [] Chest pain   [] Chest pressure   [] Palpitations   [] Shortness of breath when laying flat   [] Shortness of breath with exertion. Vascular:  [  x]Pain in legs with walking   [] Pain in legs at rest  [] History of DVT   [] Phlebitis   [x] Swelling in legs   [] Varicose veins   [] Non-healing ulcers Pulmonary:   [] Uses home oxygen   [] Productive cough   [] Hemoptysis   [] Wheeze  [] COPD   [] Asthma Neurologic:  [] Dizziness   [] Seizures   [] History of stroke   [] History of TIA  [] Aphasia   [] Vissual changes   [] Weakness or numbness in arm   [] Weakness or numbness in leg Musculoskeletal:   [x] Joint swelling   [x] Joint pain   [x] Low back pain Hematologic:  [] Easy bruising  [] Easy bleeding   [] Hypercoagulable state   [] Anemic Gastrointestinal:  [] Diarrhea   [] Vomiting   [] Gastroesophageal reflux/heartburn   [] Difficulty swallowing. Genitourinary:  [] Chronic kidney disease   [] Difficult urination  [] Frequent urination   [] Blood in urine Skin:  [] Rashes   [] Ulcers  Psychological:  [] History of anxiety   []  History of major depression.  Physical Examination  Vitals:   03/11/16 1059  BP: (!) 145/74  Pulse: (!) 58  Resp: 16  Weight: 274 lb (124.3 kg)  Height: 5\' 10"  (1.778 m)   Body mass index is 39.31 kg/m. Gen: WD/WN, NAD Head: [place]/AT, No temporalis wasting.  Ear/Nose/Throat: Hearing grossly intact, nares w/o erythema or drainage, poor dentition Eyes: PER, EOMI, sclera nonicteric.  Neck: Supple, no masses.  No bruit or JVD.  Pulmonary:  Good air movement, clear to auscultation bilaterally, no use of accessory muscles.  Cardiac: RRR, normal S1, S2, no Murmurs. Vascular: trace edema of the right ankle with 2+ edema of the left.  Incisional scars bilateral knees well healed. Vessel Right Left  Radial Palpable Palpable  Ulnar Palpable Palpable  Brachial Palpable Palpable  Carotid Palpable Palpable  Femoral Palpable Palpable  Popliteal Palpable Palpable  PT Palpable Palpable  DP Palpable Palpable   Gastrointestinal: soft, non-distended. No guarding/no peritoneal signs.  Musculoskeletal: M/S 5/5 throughout.  No deformity or atrophy.  Neurologic: CN 2-12 intact. Pain and light touch intact in extremities.  Symmetrical.  Speech is fluent. Motor exam as listed above. Psychiatric: Judgment intact, Mood & affect appropriate for pt's clinical situation. Dermatologic: No rashes or ulcers noted.  No changes consistent with cellulitis. Lymph : No Cervical lymphadenopathy, no lichenification or skin changes of chronic lymphedema.  CBC Lab Results  Component Value Date   WBC 4.1 09/03/2013   HGB 12.0 09/03/2013   HCT 37.1 09/03/2013   MCV 81 09/03/2013   PLT 174 09/03/2013    BMET    Component Value Date/Time   NA 140 11/08/2013 1633   K 4.2  11/08/2013 1633   CL 108 (H) 11/08/2013 1633   CO2 27 11/08/2013 1633   GLUCOSE 88 11/08/2013 1633   BUN 10 11/08/2013 1633   CREATININE 1.03 11/08/2013 1633   CALCIUM 8.7 11/08/2013 1633   GFRNONAA >60 11/08/2013 1633   GFRAA >60 11/08/2013 1633   CrCl cannot be calculated (Patient's most recent lab result is older than the maximum 21 days allowed.).  COAG No results found for: INR, PROTIME  Radiology No results found.   Assessment/Plan 1. Lymphedema I have had a long discussion with the patient regarding swelling and why it  causes symptoms.  Patient will begin wearing graduated compression stockings on a daily basis a prescription was given. The patient will  beginning wearing the stockings first thing in the morning and removing them in the evening. The patient is instructed specifically not to sleep  in the stockings.   In addition, behavioral modification will be initiated.  This will include frequent elevation, use of over the counter pain medications and exercise such as walking.  I have reviewed systemic causes for chronic edema such as liver, kidney and cardiac etiologies.  The patient denies problems with these organ systems.    Consideration for a lymph pump will also be made based upon the effectiveness of conservative therapy.  This would help to improve the edema control and prevent sequela such as ulcers and infections   Patient should undergo duplex ultrasound of the venous system to ensure that DVT or reflux is not present.  The patient will follow-up with me after the ultrasound.   - VAS [place] LOWER EXTREMITY VENOUS (DVT); Future  2. Chronic venous insufficiency See #1  3. Pain of left lower extremity See #1 - VAS [place] LOWER EXTREMITY VENOUS (DVT); Future  4. Primary osteoarthritis of both knees Continue NSAIDs, further treatment per Dr Nelson Chimes, MD  03/11/2016 1:07 PM

## 2016-05-13 ENCOUNTER — Encounter (INDEPENDENT_AMBULATORY_CARE_PROVIDER_SITE_OTHER): Payer: Medicare Other

## 2016-05-13 ENCOUNTER — Ambulatory Visit (INDEPENDENT_AMBULATORY_CARE_PROVIDER_SITE_OTHER): Payer: Medicare Other | Admitting: Vascular Surgery

## 2016-06-24 ENCOUNTER — Encounter (INDEPENDENT_AMBULATORY_CARE_PROVIDER_SITE_OTHER): Payer: Medicare Other

## 2016-06-24 ENCOUNTER — Ambulatory Visit (INDEPENDENT_AMBULATORY_CARE_PROVIDER_SITE_OTHER): Payer: Medicare Other | Admitting: Vascular Surgery

## 2016-08-12 ENCOUNTER — Other Ambulatory Visit: Payer: Self-pay | Admitting: Family Medicine

## 2016-11-01 ENCOUNTER — Encounter: Payer: Self-pay | Admitting: Emergency Medicine

## 2016-11-01 ENCOUNTER — Emergency Department
Admission: EM | Admit: 2016-11-01 | Discharge: 2016-11-01 | Disposition: A | Discharge status: Home / Self Care | Payer: Medicare Other | Attending: Emergency Medicine | Admitting: Emergency Medicine

## 2016-11-01 ENCOUNTER — Emergency Department: Payer: Medicare Other

## 2016-11-01 DIAGNOSIS — K5792 Diverticulitis of intestine, part unspecified, without perforation or abscess without bleeding: Secondary | ICD-10-CM

## 2016-11-01 DIAGNOSIS — K5790 Diverticulosis of intestine, part unspecified, without perforation or abscess without bleeding: Secondary | ICD-10-CM | POA: Insufficient documentation

## 2016-11-01 DIAGNOSIS — Z79899 Other long term (current) drug therapy: Secondary | ICD-10-CM | POA: Diagnosis not present

## 2016-11-01 DIAGNOSIS — R109 Unspecified abdominal pain: Secondary | ICD-10-CM | POA: Diagnosis present

## 2016-11-01 DIAGNOSIS — J45909 Unspecified asthma, uncomplicated: Secondary | ICD-10-CM | POA: Diagnosis not present

## 2016-11-01 LAB — COMPREHENSIVE METABOLIC PANEL
ALT: 17 U/L (ref 14–54)
ANION GAP: 7 (ref 5–15)
AST: 21 U/L (ref 15–41)
Albumin: 4.3 g/dL (ref 3.5–5.0)
Alkaline Phosphatase: 89 U/L (ref 38–126)
BUN: 9 mg/dL (ref 6–20)
CHLORIDE: 102 mmol/L (ref 101–111)
CO2: 30 mmol/L (ref 22–32)
Calcium: 9.7 mg/dL (ref 8.9–10.3)
Creatinine, Ser: 1.06 mg/dL — ABNORMAL HIGH (ref 0.44–1.00)
GFR, EST NON AFRICAN AMERICAN: 58 mL/min — AB (ref >60)
Glucose, Bld: 113 mg/dL — ABNORMAL HIGH (ref 65–99)
POTASSIUM: 3.6 mmol/L (ref 3.5–5.1)
Sodium: 139 mmol/L (ref 135–145)
TOTAL PROTEIN: 7.9 g/dL (ref 6.5–8.1)
Total Bilirubin: 0.4 mg/dL (ref 0.3–1.2)

## 2016-11-01 LAB — URINALYSIS, COMPLETE (UACMP) WITH MICROSCOPIC
BILIRUBIN URINE: NEGATIVE
Bacteria, UA: NONE SEEN
GLUCOSE, UA: NEGATIVE mg/dL
Ketones, ur: NEGATIVE mg/dL
Leukocytes, UA: NEGATIVE
NITRITE: NEGATIVE
Protein, ur: NEGATIVE mg/dL
SPECIFIC GRAVITY, URINE: 1.006 (ref 1.005–1.030)
pH: 7 (ref 5.0–8.0)

## 2016-11-01 LAB — CBC
HCT: 39.1 % (ref 35.0–47.0)
Hemoglobin: 13 g/dL (ref 12.0–16.0)
MCH: 26.8 pg (ref 26.0–34.0)
MCHC: 33.2 g/dL (ref 32.0–36.0)
MCV: 80.8 fL (ref 80.0–100.0)
PLATELETS: 192 10*3/uL (ref 150–440)
RBC: 4.84 MIL/uL (ref 3.80–5.20)
RDW: 14.1 % (ref 11.5–14.5)
WBC: 5.2 10*3/uL (ref 3.6–11.0)

## 2016-11-01 LAB — LIPASE, BLOOD: LIPASE: 26 U/L (ref 11–51)

## 2016-11-01 MED ORDER — METRONIDAZOLE 500 MG PO TABS
500.0000 mg | ORAL_TABLET | Freq: Three times a day (TID) | ORAL | 0 refills | Status: AC
Start: 2016-11-01 — End: 2016-11-15

## 2016-11-01 MED ORDER — IOPAMIDOL (ISOVUE-300) INJECTION 61%
30.0000 mL | Freq: Once | INTRAVENOUS | Status: AC | PRN
  Administered 2016-11-01: 30 mL via ORAL

## 2016-11-01 MED ORDER — METRONIDAZOLE 500 MG PO TABS
500.0000 mg | ORAL_TABLET | Freq: Once | ORAL | Status: AC
  Administered 2016-11-01: 500 mg via ORAL
  Filled 2016-11-01: qty 1

## 2016-11-01 MED ORDER — ONDANSETRON 4 MG PO TBDP
4.0000 mg | ORAL_TABLET | Freq: Once | ORAL | Status: AC
  Administered 2016-11-01: 4 mg via ORAL
  Filled 2016-11-01: qty 1

## 2016-11-01 MED ORDER — IOPAMIDOL (ISOVUE-300) INJECTION 61%
125.0000 mL | Freq: Once | INTRAVENOUS | Status: AC | PRN
  Administered 2016-11-01: 125 mL via INTRAVENOUS

## 2016-11-01 MED ORDER — CIPROFLOXACIN HCL 500 MG PO TABS
500.0000 mg | ORAL_TABLET | Freq: Once | ORAL | Status: AC
  Administered 2016-11-01: 500 mg via ORAL
  Filled 2016-11-01: qty 1

## 2016-11-01 MED ORDER — ONDANSETRON HCL 4 MG/2ML IJ SOLN
4.0000 mg | Freq: Once | INTRAMUSCULAR | Status: AC
  Administered 2016-11-01: 4 mg via INTRAVENOUS
  Filled 2016-11-01: qty 2

## 2016-11-01 MED ORDER — ONDANSETRON 4 MG PO TBDP: 4.0000 mg | ORAL_TABLET | Freq: Three times a day (TID) | ORAL | 0 refills | Status: DC | PRN

## 2016-11-01 MED ORDER — TRAMADOL HCL 50 MG PO TABS: 50.0000 mg | ORAL_TABLET | Freq: Four times a day (QID) | ORAL | 0 refills | Status: AC | PRN

## 2016-11-01 MED ORDER — FENTANYL CITRATE (PF) 100 MCG/2ML IJ SOLN
50.0000 ug | Freq: Once | INTRAMUSCULAR | Status: AC
  Administered 2016-11-01: 50 ug via INTRAVENOUS
  Filled 2016-11-01: qty 2

## 2016-11-01 MED ORDER — SODIUM CHLORIDE 0.9 % IV BOLUS (SEPSIS)
1000.0000 mL | Freq: Once | INTRAVENOUS | Status: AC
  Administered 2016-11-01: 1000 mL via INTRAVENOUS

## 2016-11-01 MED ORDER — CIPROFLOXACIN HCL 500 MG PO TABS: 500.0000 mg | ORAL_TABLET | Freq: Two times a day (BID) | ORAL | 0 refills | Status: AC

## 2016-11-01 NOTE — ED Provider Notes (Signed)
[facility] Emergency Department Provider Note  Time seen: 6:36 PM  I have reviewed the triage vital signs and the nursing notes.   HISTORY  Chief Complaint Flank Pain    HPI Zandrea Kenealy is a 56 y.o. female with a past medical history of asthma, depression, presents to the emergency department for left-sided abdominal pain. According to the patient since last night she's been experiencing pain in her left back radiating into her left lower quadrant. States nausea but denies any vomiting. Denies any diarrhea. Denies any black or bloody stool. Her last bowel movement was 4 days ago which is normal per patient. Denies dysuria or hematuria. Denies fever. States the pain as a moderate dull aching type pain mostly in the back with a mild pain in her left lower quadrant.  Past Medical History:  Diagnosis Date  . Arthritis    knee  . Asthma    no inhalers  . Depression   . Psoriasis   . Sickle cell trait (HCC)   . Vertigo    none in 2 yrs  . Wears partial dentures    upper and lower    Patient Active Problem List   Diagnosis Date Noted  . Lymphedema 03/11/2016  . Chronic venous insufficiency 03/11/2016  . Pain in limb 03/11/2016  . DJD (degenerative joint disease) of knee 03/11/2016    Past Surgical History:  Procedure Laterality Date  . ABDOMINAL HYSTERECTOMY    . COLONOSCOPY    . KNEE ARTHROSCOPY Right 01/14/14   [facility]  . KNEE ARTHROSCOPY WITH LATERAL MENISECTOMY Right 09/23/2014   Procedure: KNEE ARTHROSCOPY WITH LATERAL MENISECTOMY;  Surgeon: Erin Sons, MD;  Location: [facility];  Service: Orthopedics;  Laterality: Right;  lateral chondral debridement    Prior to Admission medications   Medication Sig Start Date End Date Taking? Authorizing Provider  buPROPion (WELLBUTRIN XL) 300 MG 24 hr tablet Take 300 mg by mouth daily. AM    [provider]  carbamazepine (TEGRETOL) 200 MG tablet Take 200 mg by mouth at bedtime.     [provider]  oxycodone (OXY-IR) 5 MG capsule Take 5 mg by mouth every 8 (eight) hours as needed.    [provider]  oxyCODONE-acetaminophen (ROXICET) 5-325 MG per tablet Take 1-2 tablets by mouth every 6 (six) hours as needed for severe pain. Patient not taking: Reported on 03/11/2016 09/23/14   Erin Sons, MD  oxyCODONE-acetaminophen (ROXICET) 5-325 MG per tablet Take 1-2 tablets by mouth every 6 (six) hours as needed for severe pain. Patient not taking: Reported on 03/11/2016 09/23/14   Erin Sons, MD  ziprasidone (GEODON) 60 MG capsule Take 60 mg by mouth at bedtime.    [provider]  zolpidem (AMBIEN) 5 MG tablet Take 5 mg by mouth at bedtime as needed for sleep.    [provider]    Allergies  Allergen Reactions  . Adhesive [Tape] Other (See Comments)    Some bandaids cause bruising  . Codeine Sulfate Hives    Family History  Problem Relation Age of Onset  . Congestive Heart Failure Mother   . Hypertension Father   . Stroke Paternal Uncle   . Breast cancer Neg Hx     Social History Social History  Substance Use Topics  . Smoking status: Never Smoker  . Smokeless tobacco: Never Used  . Alcohol use No    Review of Systems Constitutional: Negative for fever Cardiovascular: Negative for chest pain. Respiratory: Negative for  shortness of breath. Gastrointestinal: Left back and left lower quadrant pain. Negative for vomiting or diarrhea. Positive for nausea. Genitourinary: Negative for dysuria. Musculoskeletal: Left back pain Neurological: Negative for headache All other ROS negative  ____________________________________________   PHYSICAL EXAM:  VITAL SIGNS: ED Triage Vitals  Enc Vitals Group     BP 11/01/16 1641 139/75     Pulse Rate 11/01/16 1641 72     Resp 11/01/16 1641 20     Temp 11/01/16 1641 98.3 F (36.8 C)     Temp Source 11/01/16 1641 Oral     SpO2 11/01/16 1641 98 %     Weight 11/01/16 1643 293  lb (132.9 kg)     Height 11/01/16 1643 5\' 10"  (1.778 m)     Head Circumference --      Peak Flow --      Pain Score 11/01/16 1647 8     Pain Loc --      Pain Edu? --      Excl. in GC? --     Constitutional: Alert and oriented. Well appearing and in no distress. Eyes: Normal exam ENT   Head: Normocephalic and atraumatic   Mouth/Throat: Mucous membranes are moist. Cardiovascular: Normal rate, regular rhythm. No murmur Respiratory: Normal respiratory effort without tachypnea nor retractions. Breath sounds are clear  Gastrointestinal: Soft, mild left lower quadrant tenderness palpation, moderate left CVA tenderness. Abdomen otherwise benign. No rebound or guarding. No distention. Musculoskeletal: Nontender with normal range of motion in all extremities. Neurologic:  Normal speech and language. No gross focal neurologic deficits  Skin:  Skin is warm, dry and intact.  Psychiatric: Mood and affect are normal.   ____________________________________________    RADIOLOGY  CT consistent with acute diverticulitis.  ____________________________________________   INITIAL IMPRESSION / ASSESSMENT AND PLAN / ED COURSE  Pertinent labs & imaging results that were available during my care of the patient were reviewed by me and considered in my medical decision making (see chart for details).  [facility] department for left-sided abdominal pain and back pain since last night with nausea. Moderate to mild tenderness on exam. We will check labs obtain a CT scan of the abdomen/pelvis further evaluate. We will treat pain and nausea, IV hydrate while awaiting results. Patient agreeable to this plan.  CT consistent with acute diverticulitis per labs are largely within normal limits. We will discharge with ciprofloxacin, Flagyl and pain medication with GI follow-up. I discussed return precautions.  ____________________________________________   FINAL CLINICAL IMPRESSION(S) /  ED DIAGNOSES  Left abdominal pain Diverticulitis   Minna AntisPaduchowski, Leahanna Buser, MD 11/01/16 2029

## 2016-11-01 NOTE — ED Triage Notes (Signed)
Pt in via POV with complaints of increasing left flank pain and nausea since yesterday.  Pt denies any urinary symptoms, pt ambulatory to triage, NAD noted at this time.

## 2016-11-01 NOTE — ED Notes (Signed)
Patient given cup of ice per patient's request and with Dr. Serita GrammesPaduchowki's approval.

## 2016-12-04 ENCOUNTER — Other Ambulatory Visit: Payer: Self-pay | Admitting: Nurse Practitioner

## 2016-12-04 DIAGNOSIS — Z1231 Encounter for screening mammogram for malignant neoplasm of breast: Secondary | ICD-10-CM

## 2016-12-27 ENCOUNTER — Ambulatory Visit
Admission: RE | Admit: 2016-12-27 | Discharge: 2016-12-27 | Disposition: A | Discharge status: Home / Self Care | Payer: Medicare Other | Source: Ambulatory Visit | Attending: Nurse Practitioner | Admitting: Nurse Practitioner

## 2016-12-27 DIAGNOSIS — Z1231 Encounter for screening mammogram for malignant neoplasm of breast: Secondary | ICD-10-CM | POA: Insufficient documentation

## 2017-12-10 ENCOUNTER — Other Ambulatory Visit: Payer: Self-pay | Admitting: Nurse Practitioner

## 2017-12-10 DIAGNOSIS — Z1231 Encounter for screening mammogram for malignant neoplasm of breast: Secondary | ICD-10-CM

## 2018-01-23 ENCOUNTER — Ambulatory Visit
Admission: RE | Admit: 2018-01-23 | Discharge: 2018-01-23 | Disposition: A | Discharge status: Home / Self Care | Payer: Medicare Other | Source: Ambulatory Visit | Attending: Nurse Practitioner | Admitting: Nurse Practitioner

## 2018-01-23 DIAGNOSIS — Z1231 Encounter for screening mammogram for malignant neoplasm of breast: Secondary | ICD-10-CM | POA: Diagnosis present

## 2018-10-17 ENCOUNTER — Emergency Department
Admission: EM | Admit: 2018-10-17 | Discharge: 2018-10-18 | Disposition: A | Discharge status: Home / Self Care | Payer: Medicare Other | Attending: Emergency Medicine | Admitting: Emergency Medicine

## 2018-10-17 ENCOUNTER — Emergency Department: Payer: Medicare Other

## 2018-10-17 ENCOUNTER — Other Ambulatory Visit: Payer: Self-pay

## 2018-10-17 ENCOUNTER — Encounter: Payer: Self-pay | Admitting: Emergency Medicine

## 2018-10-17 DIAGNOSIS — M62830 Muscle spasm of back: Secondary | ICD-10-CM | POA: Diagnosis not present

## 2018-10-17 DIAGNOSIS — M549 Dorsalgia, unspecified: Secondary | ICD-10-CM | POA: Diagnosis present

## 2018-10-17 DIAGNOSIS — Z79899 Other long term (current) drug therapy: Secondary | ICD-10-CM | POA: Diagnosis not present

## 2018-10-17 DIAGNOSIS — J45909 Unspecified asthma, uncomplicated: Secondary | ICD-10-CM | POA: Diagnosis not present

## 2018-10-17 DIAGNOSIS — M546 Pain in thoracic spine: Secondary | ICD-10-CM | POA: Insufficient documentation

## 2018-10-17 LAB — BASIC METABOLIC PANEL
Anion gap: 10 (ref 5–15)
BUN: 14 mg/dL (ref 6–20)
CO2: 24 mmol/L (ref 22–32)
Calcium: 9.3 mg/dL (ref 8.9–10.3)
Chloride: 106 mmol/L (ref 98–111)
Creatinine, Ser: 1.06 mg/dL — ABNORMAL HIGH (ref 0.44–1.00)
GFR calc Af Amer: >60 mL/min (ref >60)
GFR calc non Af Amer: 59 mL/min — ABNORMAL LOW (ref >60)
Glucose, Bld: 98 mg/dL (ref 70–99)
Potassium: 3.7 mmol/L (ref 3.5–5.1)
Sodium: 140 mmol/L (ref 135–145)

## 2018-10-17 LAB — CBC
HCT: 37.8 % (ref 36.0–46.0)
Hemoglobin: 12.4 g/dL (ref 12.0–15.0)
MCH: 26.7 pg (ref 26.0–34.0)
MCHC: 32.8 g/dL (ref 30.0–36.0)
MCV: 81.5 fL (ref 80.0–100.0)
Platelets: 194 10*3/uL (ref 150–400)
RBC: 4.64 MIL/uL (ref 3.87–5.11)
RDW: 13.7 % (ref 11.5–15.5)
WBC: 5.6 10*3/uL (ref 4.0–10.5)
nRBC: 0 % (ref 0.0–0.2)

## 2018-10-17 LAB — TROPONIN I (HIGH SENSITIVITY): Troponin I (High Sensitivity): <2 ng/L (ref <18)

## 2018-10-17 NOTE — ED Triage Notes (Signed)
Pt to ED from home c/o upper mid back pain that is sharp and radiating to left shoulder.  Denies SOB, denies n/v/d, denies significant cardiac hx.  States pain started about 3hrs PTA and was lying in bed, pain is worse with movement.  Denies known injury.  Chest rise even and unlabored, skin warm and dry, in NAD at this time.

## 2018-10-18 DIAGNOSIS — M62830 Muscle spasm of back: Secondary | ICD-10-CM | POA: Diagnosis not present

## 2018-10-18 MED ORDER — IBUPROFEN 800 MG PO TABS: 800.0000 mg | ORAL_TABLET | Freq: Three times a day (TID) | ORAL | 0 refills | Status: DC | PRN

## 2018-10-18 MED ORDER — OXYCODONE-ACETAMINOPHEN 5-325 MG PO TABS
1.0000 | ORAL_TABLET | Freq: Once | ORAL | Status: AC
  Administered 2018-10-18: 1 via ORAL
  Filled 2018-10-18: qty 1

## 2018-10-18 MED ORDER — CYCLOBENZAPRINE HCL 5 MG PO TABS: 5.0000 mg | ORAL_TABLET | Freq: Three times a day (TID) | ORAL | 0 refills | Status: DC | PRN

## 2018-10-18 MED ORDER — KETOROLAC TROMETHAMINE 30 MG/ML IJ SOLN
30.0000 mg | Freq: Once | INTRAMUSCULAR | Status: AC
  Administered 2018-10-18: 30 mg via INTRAMUSCULAR
  Filled 2018-10-18: qty 1

## 2018-10-18 MED ORDER — CYCLOBENZAPRINE HCL 10 MG PO TABS
5.0000 mg | ORAL_TABLET | Freq: Once | ORAL | Status: AC
  Administered 2018-10-18: 5 mg via ORAL
  Filled 2018-10-18: qty 1

## 2018-10-18 MED ORDER — OXYCODONE-ACETAMINOPHEN 5-325 MG PO TABS: 1.0000 | ORAL_TABLET | ORAL | 0 refills | Status: DC | PRN

## 2018-10-18 NOTE — Discharge Instructions (Addendum)
1.  You may take medicines as needed for pain and muscle spasms (Motrin/Percocet/Flexeril #15). 2.  Apply moist heat to affected area several times daily. 3.  Return to the ER for worsening symptoms, persistent vomiting, difficulty breathing or other concerns. 

## 2018-10-18 NOTE — ED Provider Notes (Signed)
[facility] Emergency Department Provider Note   ____________________________________________   First MD Initiated Contact with Patient 10/18/18 0101     (approximate)  I have reviewed the triage vital signs and the nursing notes.   HISTORY  Chief Complaint Back Pain    HPI Kathryn Pratt is a 58 y.o. female who presents to the ED from home with a chief complaint of upper back pain.  Patient was at rest approximately 7:30 PM when she noted pain to her left shoulder blade.  Pain exacerbated with movement and states she feels muscle spasms.  Similar symptoms previously.  Denies recent fall/injury/trauma.  Denies associated chest pain, radiation, shortness of breath, nausea/vomiting, palpitations or dizziness.  Denies recent travel, hormone use or exposure to persons diagnosed with coronavirus.       Past Medical History:  Diagnosis Date   Arthritis    knee   Asthma    no inhalers   Depression    Psoriasis    Sickle cell trait (HCC)    Vertigo    none in 2 yrs   Wears partial dentures    upper and lower    Patient Active Problem List   Diagnosis Date Noted   Lymphedema 03/11/2016   Chronic venous insufficiency 03/11/2016   Pain in limb 03/11/2016   DJD (degenerative joint disease) of knee 03/11/2016    Past Surgical History:  Procedure Laterality Date   ABDOMINAL HYSTERECTOMY     COLONOSCOPY     KNEE ARTHROSCOPY Right 01/14/14   [facility]   KNEE ARTHROSCOPY WITH LATERAL MENISECTOMY Right 09/23/2014   Procedure: KNEE ARTHROSCOPY WITH LATERAL MENISECTOMY;  Surgeon: Erin SonsHarold Kernodle, MD;  Location: [facility];  Service: Orthopedics;  Laterality: Right;  lateral chondral debridement    Prior to Admission medications   Medication Sig Start Date End Date Taking? Authorizing Provider  buPROPion (WELLBUTRIN XL) 300 MG 24 hr tablet Take 300 mg by mouth daily. AM    [provider]  carbamazepine (TEGRETOL) 200 MG tablet  Take 200 mg by mouth at bedtime.    [provider]  cyclobenzaprine (FLEXERIL) 5 MG tablet Take 1 tablet (5 mg total) by mouth 3 (three) times daily as needed for muscle spasms. 10/18/18   Irean HongSung, Alicja Everitt J, MD  ibuprofen (ADVIL) 800 MG tablet Take 1 tablet (800 mg total) by mouth every 8 (eight) hours as needed for moderate pain. 10/18/18   Irean HongSung, Cypress Fanfan J, MD  ondansetron (ZOFRAN ODT) 4 MG disintegrating tablet Take 1 tablet (4 mg total) by mouth every 8 (eight) hours as needed for nausea or vomiting. 11/01/16   Minna AntisPaduchowski, Kevin, MD  oxycodone (OXY-IR) 5 MG capsule Take 5 mg by mouth every 8 (eight) hours as needed.    [provider]  oxyCODONE-acetaminophen (PERCOCET/ROXICET) 5-325 MG tablet Take 1 tablet by mouth every 4 (four) hours as needed for severe pain. 10/18/18   Irean HongSung, Mical Brun J, MD  ziprasidone (GEODON) 60 MG capsule Take 60 mg by mouth at bedtime.    [provider]  zolpidem (AMBIEN) 5 MG tablet Take 5 mg by mouth at bedtime as needed for sleep.    [provider]    Allergies Adhesive [tape] and Codeine sulfate  Family History  Problem Relation Age of Onset   Congestive Heart Failure Mother    Hypertension Father    Stroke Paternal Uncle    Breast cancer Neg Hx     Social History Social History   Tobacco Use  Smoking status: Never Smoker   Smokeless tobacco: Never Used  Substance Use Topics   Alcohol use: No   Drug use: No    Review of Systems  Constitutional: No fever/chills Eyes: No visual changes. ENT: No sore throat. Cardiovascular: Denies chest pain. Respiratory: Denies shortness of breath. Gastrointestinal: No abdominal pain.  No nausea, no vomiting.  No diarrhea.  No constipation. Genitourinary: Negative for dysuria. Musculoskeletal: Positive for back pain. Skin: Negative for rash. Neurological: Negative for headaches, focal weakness or numbness.   ____________________________________________   PHYSICAL  EXAM:  VITAL SIGNS: ED Triage Vitals [10/17/18 2044]  Enc Vitals Group     BP (!) 141/110     Pulse Rate 94     Resp 18     Temp 98.7 F (37.1 C)     Temp Source Oral     SpO2 97 %     Weight (!) 310 lb (140.6 kg)     Height 5\' 10"  (1.778 m)     Head Circumference      Peak Flow      Pain Score 10     Pain Loc      Pain Edu?      Excl. in GC?     Constitutional: Alert and oriented. Well appearing and in no acute distress. Eyes: Conjunctivae are normal. PERRL. EOMI. Head: Atraumatic. Nose: No congestion/rhinnorhea. Mouth/Throat: Mucous membranes are moist.  Oropharynx non-erythematous. Neck: No stridor.  No cervical spine tenderness to palpation. Cardiovascular: Normal rate, regular rhythm. Grossly normal heart sounds.  Good peripheral circulation. Respiratory: Normal respiratory effort.  No retractions. Lungs CTAB. Gastrointestinal: Soft and nontender. No distention. No abdominal bruits. No CVA tenderness. Musculoskeletal: Left trapezius muscle spasms.  Left upper thoracic area tender to palpation.  Limited range of motion left shoulder secondary to left upper back pain.  2+ radial pulses.  Brisk, less than 5-second capillary refill. No lower extremity tenderness nor edema.  No joint effusions. Neurologic:  Normal speech and language. No gross focal neurologic deficits are appreciated. No gait instability. Skin:  Skin is warm, dry and intact. No rash noted. Psychiatric: Mood and affect are normal. Speech and behavior are normal.  ____________________________________________   LABS (all labs ordered are listed, but only abnormal results are displayed)  Labs Reviewed  BASIC METABOLIC PANEL - Abnormal; Notable for the following components:      Result Value   Creatinine, Ser 1.06 (*)    GFR calc non Af Amer 59 (*)    All other components within normal limits  CBC  TROPONIN I (HIGH SENSITIVITY)  TROPONIN I (HIGH SENSITIVITY)  POC URINE PREG, ED    ____________________________________________  EKG  ED ECG REPORT I, Azhar Yogi J, the attending physician, personally viewed and interpreted this ECG.   Date: 10/18/2018  EKG Time: 2050  Rate: 80  Rhythm: normal EKG, normal sinus rhythm  Axis: Normal  Intervals:none  ST&T Change: Nonspecific  ____________________________________________  RADIOLOGY  ED MD interpretation: No acute cardiopulmonary process  Official radiology report(s): Dg Chest 2 View  Result Date: 10/17/2018 CLINICAL DATA:  Chest pain radiating to left shoulder for 3 hours. EXAM: CHEST - 2 VIEW COMPARISON:  09/03/2013 FINDINGS: The heart size and mediastinal contours are within normal limits. Low lung volumes are noted. Both lungs are clear. The visualized skeletal structures are unremarkable. IMPRESSION: Low lung volumes.  No active disease. Electronically Signed   By: Danae OrleansJohn A Stahl M.D.   On: 10/17/2018 21:34    ____________________________________________  PROCEDURES  Procedure(s) performed (including Critical Care):  Procedures   ____________________________________________   INITIAL IMPRESSION / ASSESSMENT AND PLAN / ED COURSE  As part of my medical decision making, I reviewed the following data within the [facility] notes reviewed and incorporated, Labs reviewed, EKG interpreted, Old chart reviewed, Radiograph reviewed and Notes from prior ED visits     Kathryn Pratt was evaluated in Emergency Department on 10/18/2018 for the symptoms described in the history of present illness. She was evaluated in the context of the global COVID-19 pandemic, which necessitated consideration that the patient might be at risk for infection with the SARS-CoV-2 virus that causes COVID-19. Institutional protocols and algorithms that pertain to the evaluation of patients at risk for COVID-19 are in a state of rapid change based on information released by regulatory bodies including the CDC and  federal and state organizations. These policies and algorithms were followed during the patient's care in the ED.   58 year old female who presents with reproducible left upper back pain.  Diagnosis includes but is not limited to musculoskeletal, cardiac, metabolic, neurologic etiologies, etc.  Laboratory results including troponin unremarkable.  Clinically cause is clearly musculoskeletal in nature and do not feel repeat troponin is warranted.  Will administer NSAIDs, analgesia and muscle relaxer.  Clinical Course as of Oct 17 432  Sun Oct 18, 2018  0236 Patient feeling significantly better.  Strict return precautions given.  Patient verbalizes understanding agrees with plan of care.   [JS]    Clinical Course User Index [JS] Paulette Blanch, MD     ____________________________________________   FINAL CLINICAL IMPRESSION(S) / ED DIAGNOSES  Final diagnoses:  Muscle spasm of back  Acute left-sided thoracic back pain     ED Discharge Orders         Ordered    ibuprofen (ADVIL) 800 MG tablet  Every 8 hours PRN     10/18/18 0152    oxyCODONE-acetaminophen (PERCOCET/ROXICET) 5-325 MG tablet  Every 4 hours PRN     10/18/18 0152    cyclobenzaprine (FLEXERIL) 5 MG tablet  3 times daily PRN     10/18/18 0152           Note:  This document was prepared using Dragon voice recognition software and may include unintentional dictation errors.   Paulette Blanch, MD 10/18/18 [phone]

## 2018-10-18 NOTE — ED Notes (Signed)
See triage note. Pt c/o L upper back pain. Denies SOB/CP/jaw or shoulder pain or numbness. Pt states history of HTN and states took her BP meds today. Pt denies cardiac history but states mother had extensive cardiac history. Pt states she has experienced this type of pain a few times before.

## 2019-03-21 IMAGING — CT CT ABD-PELV W/ CM
2 of 5 series · 15 of 46 positions shown, 17 images · IV contrast (APPLIED)
Comparison: CT 10/10/2009

CLINICAL DATA: Left lower quadrant and left flank pain. Nausea.
Progressive symptoms since yesterday.

EXAM:
CT ABDOMEN AND PELVIS WITH CONTRAST
TECHNIQUE: Multidetector CT imaging of the abdomen and pelvis was performed
using the standard protocol following bolus administration of
intravenous contrast.
CONTRAST:  125mL ZLNVCS-O22 IOPAMIDOL (ZLNVCS-O22) INJECTION 61%

[Series 2: routine abd/pel with · axial · 0.78mm/px · z∈[-1231,-726]mm · 12 of 113 slices shown, 14 images]
[im 6/113  soft-tissue]
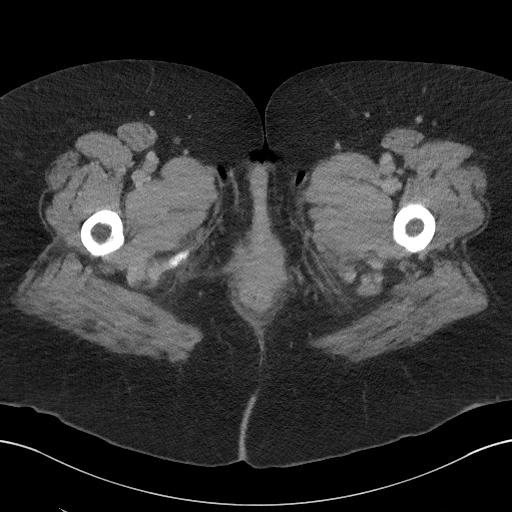
[im 6/113  bone]
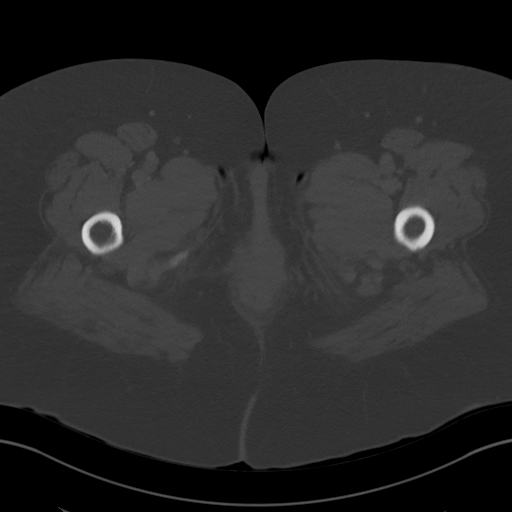
[im 17/113  soft-tissue]
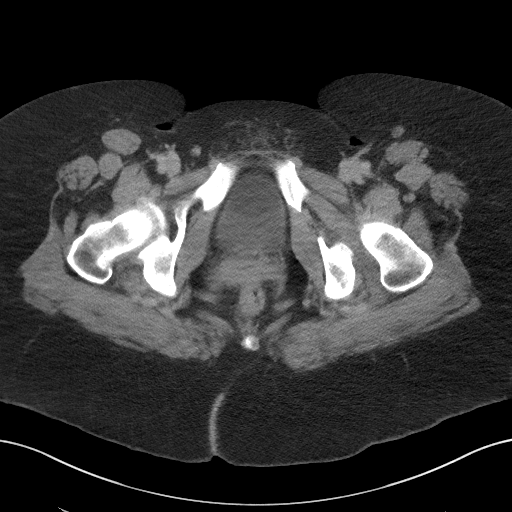
[im 23/113  soft-tissue]
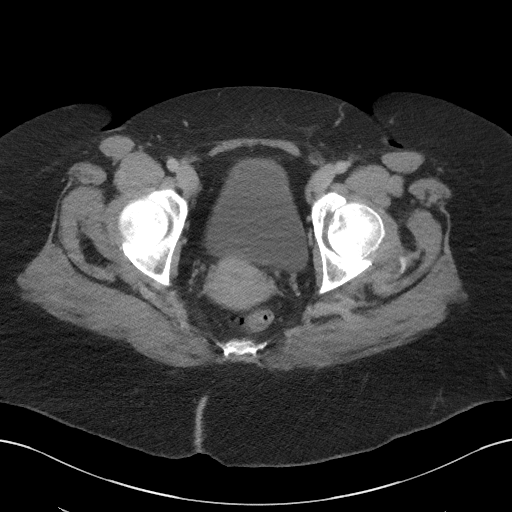
[im 34/113  soft-tissue]
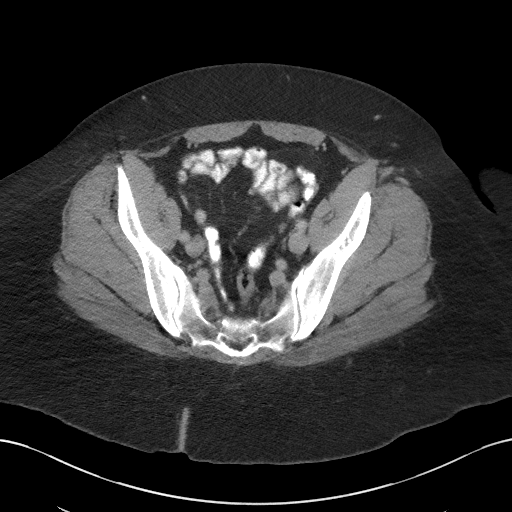
[im 45/113  soft-tissue]
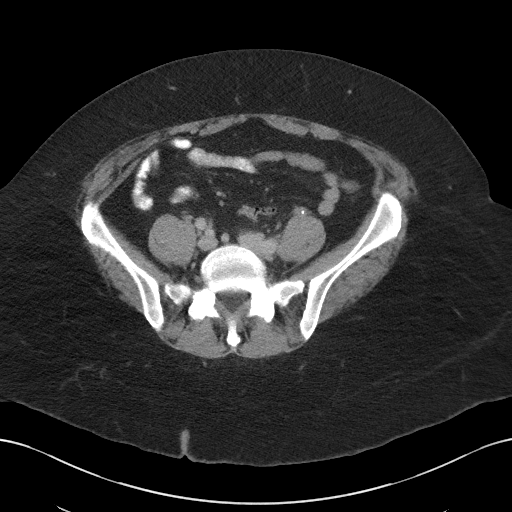
[im 51/113  soft-tissue]
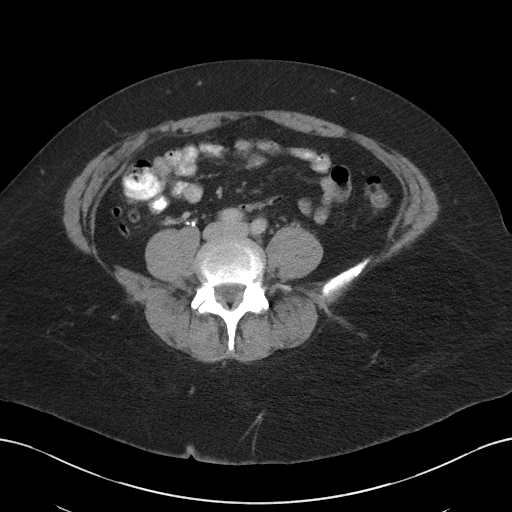
[im 62/113  soft-tissue]
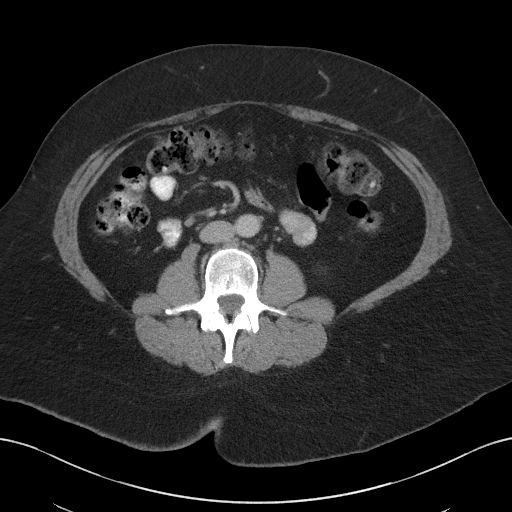
[im 68/113  soft-tissue]
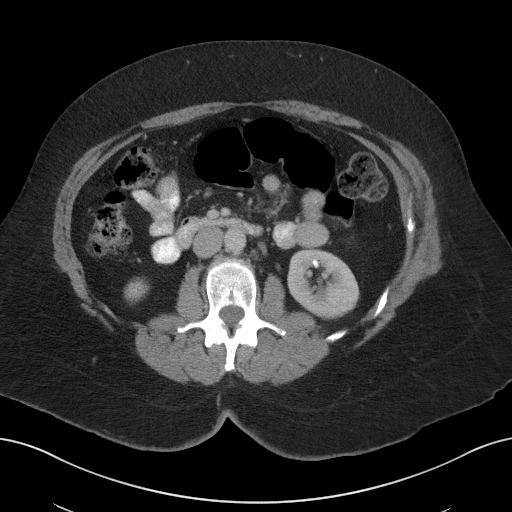
[im 79/113  soft-tissue]
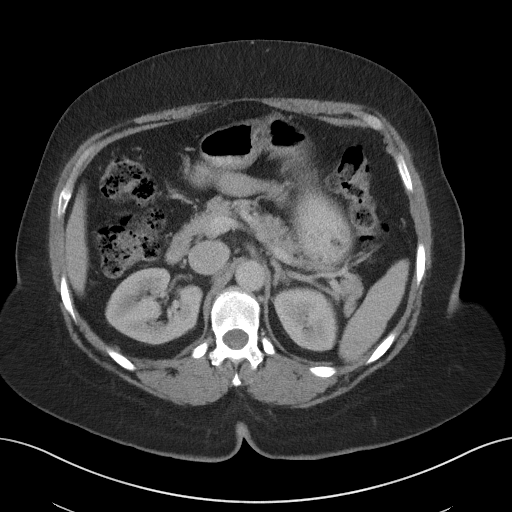
[im 79/113  bone]
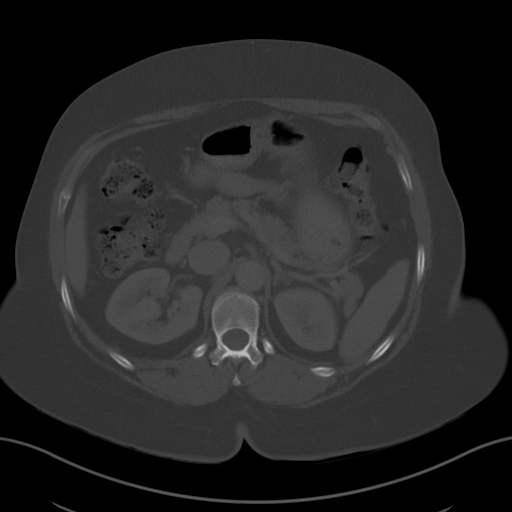
[im 90/113  soft-tissue]
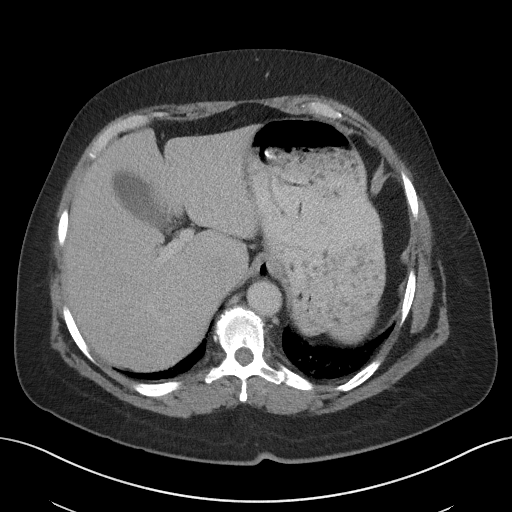
[im 96/113  soft-tissue]
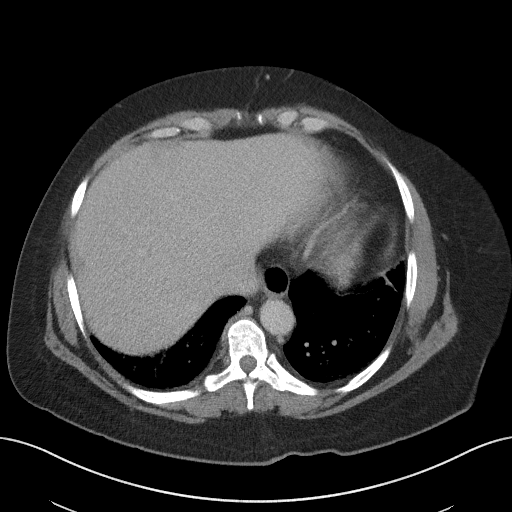
[im 107/113  soft-tissue]
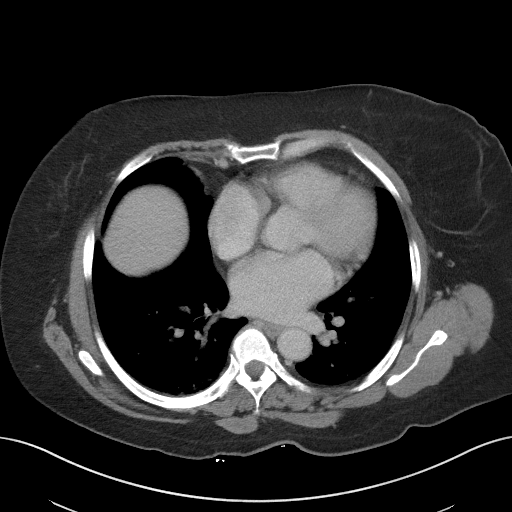

[Series 5: coronal st · coronal · 0.79mm/px · 3 of 96 slices shown]
[im 32/96  soft-tissue]
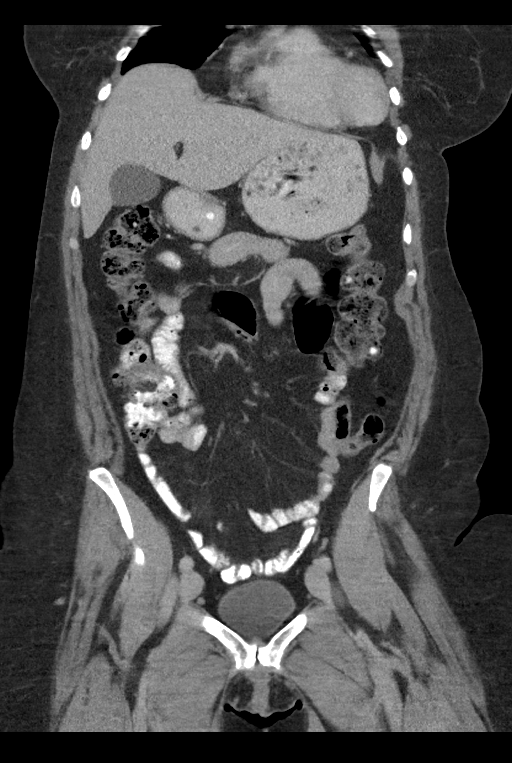
[im 43/96  soft-tissue]
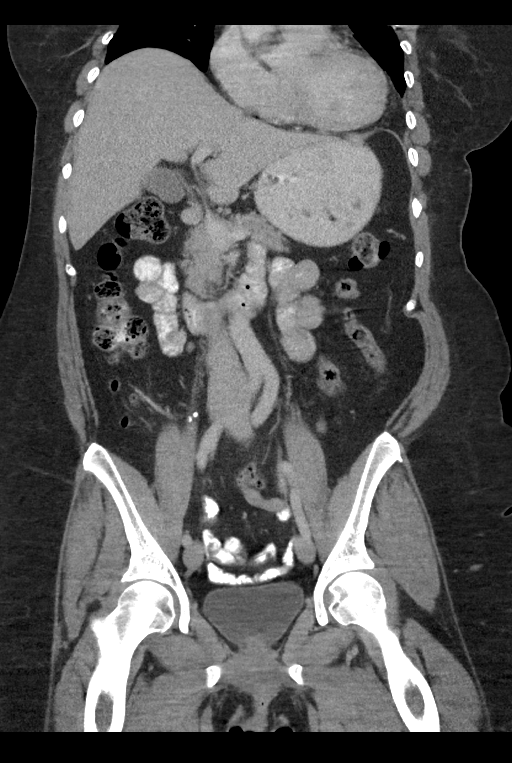
[im 53/96  soft-tissue]
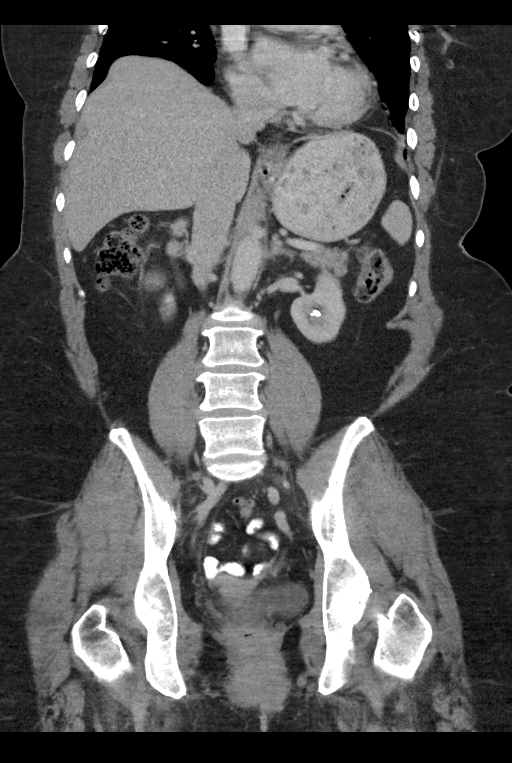

[15 of 46 positions shown; findings below may reference images not displayed]

FINDINGS: Lower chest: Dependent bibasilar atelectasis. No pleural fluid. No
consolidation. Heart is upper normal in size.

Hepatobiliary: No focal liver abnormality is seen. No gallstones,
gallbladder wall thickening, or biliary dilatation.

Pancreas: No ductal dilatation or inflammation.

Spleen: Normal in size without focal abnormality.

Adrenals/Urinary Tract: Normal adrenal glands. No hydronephrosis or
perinephric edema. Homogeneous renal enhancement with symmetric
excretion on delayed phase imaging. Nonobstructing stones in the
lower left kidney largest measuring 6 mm. Tiny subcentimeter
low-density lesion in the right mid kidney is too small to
characterize. Urinary bladder is physiologically distended with no
bladder wall thickening.

Stomach/Bowel: Faint pericolonic fat stranding in the left lower
abdomen adjacent to the sigmoid colon in the region of multiple
diverticula, suspicious for mild acute diverticulitis. No
perforation, no abscess. There is sigmoid colonic tortuosity.
Moderate stool in the more proximal colon. Normal air-filled
appendix. No small bowel dilatation, obstruction or inflammation,
enteric contrast reaches the colon. The stomach is physiologically
distended. Patulous distal esophagus.

Vascular/Lymphatic: No significant vascular findings are present. No
enlarged abdominal or pelvic lymph nodes.

Reproductive: Status post hysterectomy. No adnexal masses. Ovaries
are symmetric and quiescent.

Other: No free air, free fluid, or intra-abdominal fluid collection.

Musculoskeletal: There are no acute or suspicious osseous
abnormalities.
IMPRESSION: 1. Findings suspicious for uncomplicated acute diverticulitis of the
mid sigmoid colon. No perforation or abscess.
2. Nonobstructing left nephrolithiasis.

## 2019-03-23 ENCOUNTER — Other Ambulatory Visit: Payer: Self-pay | Admitting: Family Medicine

## 2019-03-23 DIAGNOSIS — Z1231 Encounter for screening mammogram for malignant neoplasm of breast: Secondary | ICD-10-CM

## 2019-09-22 ENCOUNTER — Other Ambulatory Visit: Payer: Self-pay | Admitting: Family Medicine

## 2019-09-22 DIAGNOSIS — Z1231 Encounter for screening mammogram for malignant neoplasm of breast: Secondary | ICD-10-CM

## 2021-02-17 ENCOUNTER — Emergency Department
Admission: EM | Admit: 2021-02-17 | Discharge: 2021-02-17 | Disposition: A | Discharge status: Home / Self Care | Payer: Medicare Other | Attending: Emergency Medicine | Admitting: Emergency Medicine

## 2021-02-17 ENCOUNTER — Encounter: Payer: Self-pay | Admitting: Emergency Medicine

## 2021-02-17 ENCOUNTER — Other Ambulatory Visit: Payer: Self-pay

## 2021-02-17 DIAGNOSIS — H538 Other visual disturbances: Secondary | ICD-10-CM | POA: Insufficient documentation

## 2021-02-17 DIAGNOSIS — R42 Dizziness and giddiness: Secondary | ICD-10-CM | POA: Insufficient documentation

## 2021-02-17 DIAGNOSIS — R110 Nausea: Secondary | ICD-10-CM | POA: Diagnosis not present

## 2021-02-17 DIAGNOSIS — Z5321 Procedure and treatment not carried out due to patient leaving prior to being seen by health care provider: Secondary | ICD-10-CM | POA: Diagnosis not present

## 2021-02-17 LAB — URINALYSIS, ROUTINE W REFLEX MICROSCOPIC
Bilirubin Urine: NEGATIVE
Glucose, UA: NEGATIVE mg/dL
Hgb urine dipstick: NEGATIVE
Ketones, ur: NEGATIVE mg/dL
Nitrite: NEGATIVE
Protein, ur: NEGATIVE mg/dL
Specific Gravity, Urine: 1.014 (ref 1.005–1.030)
pH: 6 (ref 5.0–8.0)

## 2021-02-17 LAB — BASIC METABOLIC PANEL
Anion gap: 5 (ref 5–15)
BUN: 12 mg/dL (ref 6–20)
CO2: 29 mmol/L (ref 22–32)
Calcium: 9.4 mg/dL (ref 8.9–10.3)
Chloride: 105 mmol/L (ref 98–111)
Creatinine, Ser: 1 mg/dL (ref 0.44–1.00)
GFR, Estimated: >60 mL/min (ref >60)
Glucose, Bld: 95 mg/dL (ref 70–99)
Potassium: 3.9 mmol/L (ref 3.5–5.1)
Sodium: 139 mmol/L (ref 135–145)

## 2021-02-17 LAB — CBC
HCT: 38.3 % (ref 36.0–46.0)
Hemoglobin: 12.9 g/dL (ref 12.0–15.0)
MCH: 27.3 pg (ref 26.0–34.0)
MCHC: 33.7 g/dL (ref 30.0–36.0)
MCV: 81 fL (ref 80.0–100.0)
Platelets: 191 10*3/uL (ref 150–400)
RBC: 4.73 MIL/uL (ref 3.87–5.11)
RDW: 13.7 % (ref 11.5–15.5)
WBC: 5.2 10*3/uL (ref 4.0–10.5)
nRBC: 0 % (ref 0.0–0.2)

## 2021-02-17 NOTE — ED Triage Notes (Signed)
Pt via POV from home. Pt c/o dizziness, pt states that she felt the room was spinning. 1 episode yesterday morning and 1 episode this morning. Episodes last about 1 min. Pt also endorse nausea. Pt has a hx of vertigo. Pt states also states she has blurry vision and lightheadedness when these episodes occur. Denies pain. Pt is A&Ox4 and NAD.

## 2021-03-05 IMAGING — CR CHEST - 2 VIEW
2 series · 2 of 2 positions shown · non-contrast
Comparison: 09/03/2013

CLINICAL DATA: Chest pain radiating to left shoulder for 3 hours.

EXAM:
CHEST - 2 VIEW

[chest pa]
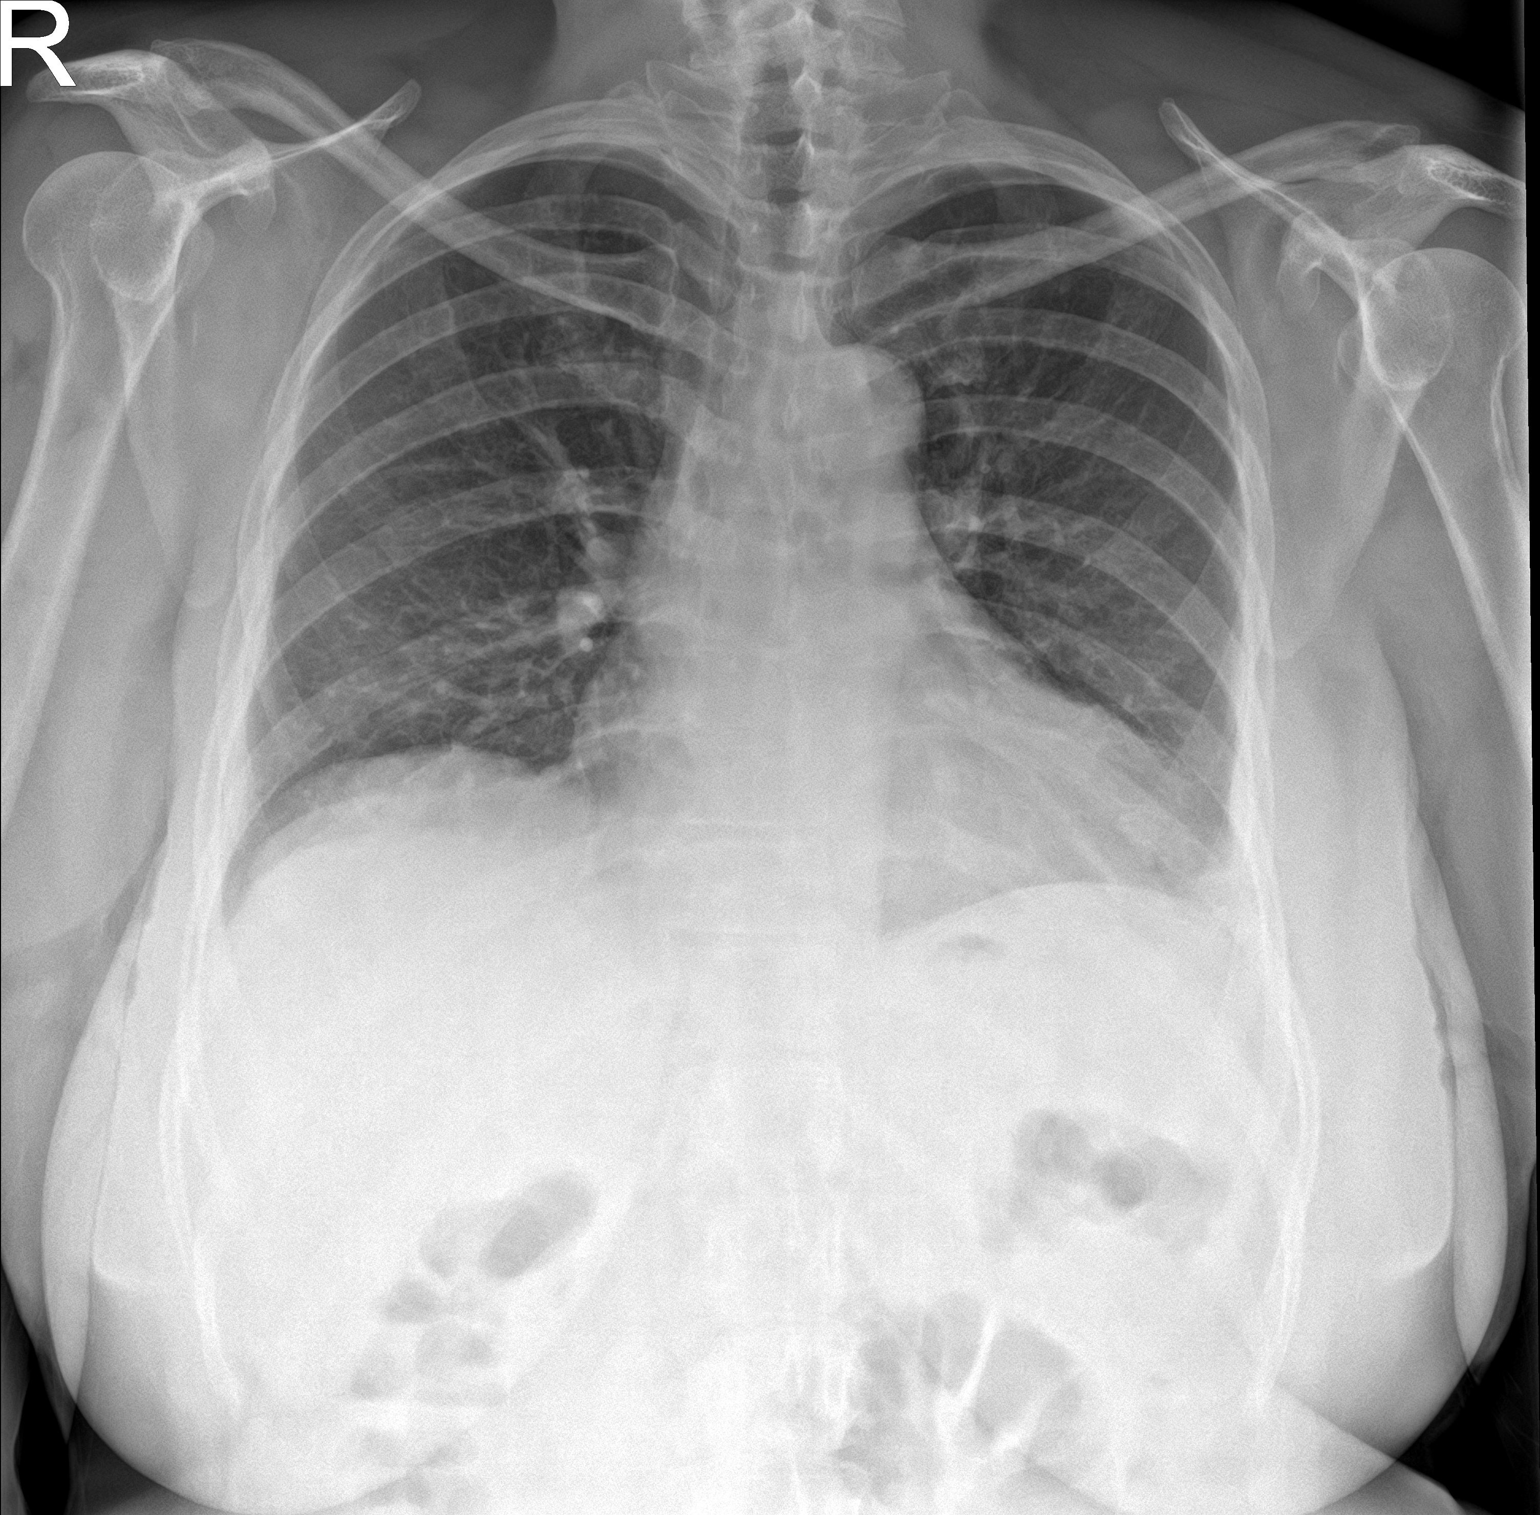

[chest lat]
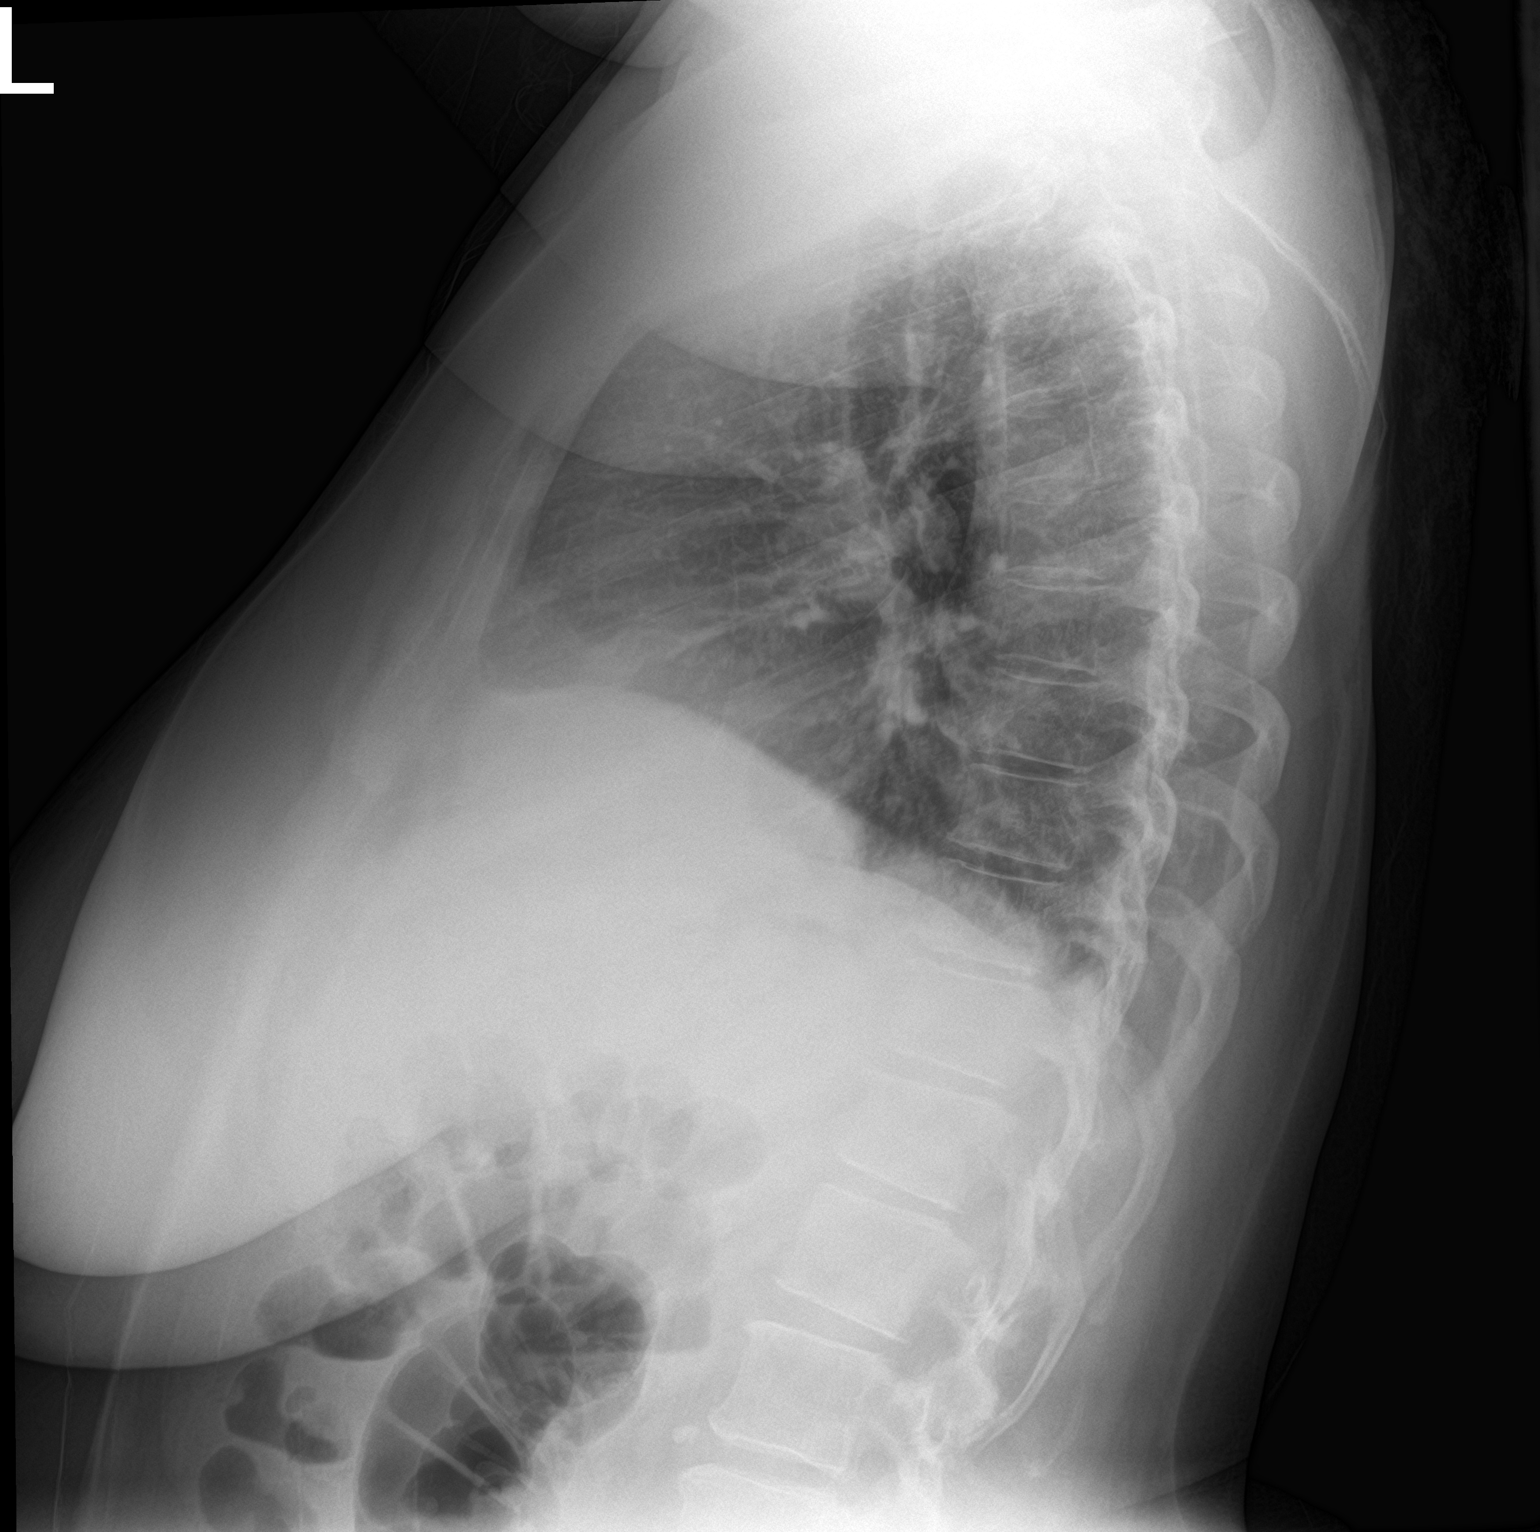

[2 of 2 positions shown; findings below may reference images not displayed]

FINDINGS: The heart size and mediastinal contours are within normal limits.
Low lung volumes are noted. Both lungs are clear. The visualized
skeletal structures are unremarkable.
IMPRESSION: Low lung volumes.  No active disease.

## 2023-04-02 ENCOUNTER — Other Ambulatory Visit: Payer: Self-pay

## 2023-04-02 ENCOUNTER — Emergency Department: Payer: 59

## 2023-04-02 ENCOUNTER — Emergency Department
Admission: EM | Admit: 2023-04-02 | Discharge: 2023-04-02 | Disposition: A | Discharge status: Home / Self Care | Payer: 59 | Attending: Emergency Medicine | Admitting: Emergency Medicine

## 2023-04-02 DIAGNOSIS — M62830 Muscle spasm of back: Secondary | ICD-10-CM | POA: Diagnosis not present

## 2023-04-02 DIAGNOSIS — J45909 Unspecified asthma, uncomplicated: Secondary | ICD-10-CM | POA: Diagnosis not present

## 2023-04-02 DIAGNOSIS — M545 Low back pain, unspecified: Secondary | ICD-10-CM | POA: Diagnosis present

## 2023-04-02 LAB — URINALYSIS, ROUTINE W REFLEX MICROSCOPIC
Bilirubin Urine: NEGATIVE
Glucose, UA: NEGATIVE mg/dL
Ketones, ur: NEGATIVE mg/dL
Leukocytes,Ua: NEGATIVE
Nitrite: NEGATIVE
Protein, ur: NEGATIVE mg/dL
Specific Gravity, Urine: 1.009 (ref 1.005–1.030)
pH: 5 (ref 5.0–8.0)

## 2023-04-02 MED ORDER — OXYCODONE HCL 5 MG PO TABS: 5.0000 mg | ORAL_TABLET | Freq: Four times a day (QID) | ORAL | 0 refills | Status: AC | PRN

## 2023-04-02 MED ORDER — METHOCARBAMOL 500 MG PO TABS: 500.0000 mg | ORAL_TABLET | Freq: Four times a day (QID) | ORAL | 0 refills | Status: AC | PRN

## 2023-04-02 MED ORDER — OXYCODONE HCL 5 MG PO TABS
5.0000 mg | ORAL_TABLET | Freq: Once | ORAL | Status: AC
  Administered 2023-04-02: 5 mg via ORAL
  Filled 2023-04-02: qty 1

## 2023-04-02 MED ORDER — METHOCARBAMOL 500 MG PO TABS
1000.0000 mg | ORAL_TABLET | Freq: Once | ORAL | Status: AC
  Administered 2023-04-02: 1000 mg via ORAL
  Filled 2023-04-02: qty 2

## 2023-04-02 NOTE — ED Triage Notes (Signed)
Pt to ED for left lower back pain started yesterday. Denies injuries. Denies urinary sx.

## 2023-04-02 NOTE — ED Notes (Signed)
See triage notes. Patient c/o lower left back pain that started yesterday. Patient denies urinary symptoms or injury.

## 2023-04-02 NOTE — ED Provider Notes (Signed)
[facility] Provider Note    Event Date/Time   First MD Initiated Contact with Patient 04/02/23 1029     (approximate)   History   Back Pain   HPI  Kathryn Pratt is a 62 y.o. female   presents to the ED with complaint of left lower back pain that started yesterday without known history of injury.  Patient denies any urinary symptoms.  She describes pain as a sharp grabbing type pain which is worsened with movement.  No history of urinary tract infections or urolithiasis.  Patient denies any nausea, vomiting, fever or chills.  Patient has a history of asthma, arthritis, sickle cell trait, psoriasis and vertigo.      Physical Exam   Triage Vital Signs: ED Triage Vitals  Encounter Vitals Group     BP 04/02/23 1009 (!) 156/74     Systolic BP Percentile --      Diastolic BP Percentile --      Pulse Rate 04/02/23 1008 60     Resp 04/02/23 1008 20     Temp 04/02/23 1008 97.6 F (36.4 C)     Temp Source 04/02/23 1008 Oral     SpO2 04/02/23 1009 95 %     Weight 04/02/23 1010 (!) 305 lb (138.3 kg)     Height 04/02/23 1010 5\' 11"  (1.803 m)     Head Circumference --      Peak Flow --      Pain Score 04/02/23 1010 10     Pain Loc --      Pain Education --      Exclude from Growth Chart --     Most recent vital signs: Vitals:   04/02/23 1008 04/02/23 1009  BP:  (!) 156/74  Pulse: 60   Resp: 20   Temp: 97.6 F (36.4 C)   SpO2:  95%     General: Awake, no distress.  CV:  Good peripheral perfusion.  Heart rate and rate rhythm. Resp:  Normal effort.  Clear bilaterally. Abd:  No distention.  Nontender to light palpation.  No CVA tenderness. Other:  Left lower paravertebral muscles in the lumbar region were tender to palpation along with the SI joint and surrounding muscles.  Patient is able to move lower extremities and was ambulatory while in the emergency department without any assistance.  Good muscle strength bilaterally.   ED Results /  Procedures / Treatments   Labs (all labs ordered are listed, but only abnormal results are displayed) Labs Reviewed  URINALYSIS, ROUTINE W REFLEX MICROSCOPIC - Abnormal; Notable for the following components:      Result Value   Color, Urine YELLOW (*)    APPearance HAZY (*)    Hgb urine dipstick SMALL (*)    Bacteria, UA MANY (*)    All other components within normal limits     RADIOLOGY Lumbar x-ray images were reviewed by myself independent of the radiologist and was negative for acute fracture or subluxation.  Official radiology report showed degenerative changes.    PROCEDURES:  Critical Care performed:   Procedures   MEDICATIONS ORDERED IN ED: Medications  methocarbamol (ROBAXIN) tablet 1,000 mg (1,000 mg Oral Given 04/02/23 1104)  oxyCODONE (Oxy IR/ROXICODONE) immediate release tablet 5 mg (5 mg Oral Given 04/02/23 1104)     IMPRESSION / MDM / ASSESSMENT AND PLAN / ED COURSE  I reviewed the triage vital signs and the nursing notes.   Differential diagnosis includes, but is not limited  to, musculoskeletal strain, musculoskeletal pain, compression fracture, urinary tract infection, urolithiasis.  62 year old female presents to the ED with complaint of left lower back pain that started yesterday without history of injury.  Urinalysis was reassuring and patient was made aware.  Patient was given methocarbamol 1000 mg p.o. along with oxycodone 5 mg while in the emergency department.  Patient did get moderate amount of relief with this medication.  Patient was reassured with the workup findings.  A prescription for continued methocarbamol and oxycodone was sent to the pharmacy for her to begin using.  She is also encouraged to use ice or heat to her back as needed for discomfort.  She is to follow-up with her PCP if any continued problems and return to the emergency department if any severe worsening of her symptoms.      Patient's presentation is most consistent with  acute illness / injury with system symptoms.  FINAL CLINICAL IMPRESSION(S) / ED DIAGNOSES   Final diagnoses:  Muscle spasm of back     Rx / DC Orders   ED Discharge Orders          Ordered    methocarbamol (ROBAXIN) 500 MG tablet  Every 6 hours PRN        04/02/23 1238    oxyCODONE (OXY IR/ROXICODONE) 5 MG immediate release tablet  Every 6 hours PRN        04/02/23 1238             Note:  This document was prepared using Dragon voice recognition software and may include unintentional dictation errors.   Tommi Rumps, PA-C 04/02/23 1401    Trinna Post, MD 04/02/23 [phone]

## 2023-04-02 NOTE — Discharge Instructions (Signed)
Follow-up your primary care provider if any continued problems.  A prescription for methocarbamol was sent to the pharmacy for you to begin taking every 6 hours as needed for muscle spasms and oxycodone if needed for pain.  You may use ice or heat to your back as needed for discomfort.

## 2023-05-28 ENCOUNTER — Other Ambulatory Visit: Payer: Self-pay | Admitting: Family Medicine

## 2023-05-28 DIAGNOSIS — Z1231 Encounter for screening mammogram for malignant neoplasm of breast: Secondary | ICD-10-CM

## 2023-06-04 ENCOUNTER — Ambulatory Visit
Admission: RE | Admit: 2023-06-04 | Discharge: 2023-06-04 | Disposition: A | Discharge status: Home / Self Care | Payer: 59 | Source: Ambulatory Visit | Attending: Family Medicine | Admitting: Family Medicine

## 2023-06-04 DIAGNOSIS — Z1231 Encounter for screening mammogram for malignant neoplasm of breast: Secondary | ICD-10-CM | POA: Insufficient documentation
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
## 2023-11-03 ENCOUNTER — Other Ambulatory Visit: Payer: Self-pay

## 2023-11-03 ENCOUNTER — Encounter: Payer: Self-pay | Admitting: Psychiatry

## 2023-11-03 ENCOUNTER — Ambulatory Visit (INDEPENDENT_AMBULATORY_CARE_PROVIDER_SITE_OTHER): Admitting: Psychiatry

## 2023-11-03 VITALS — BP 122/73 | HR 76 | Temp 96.5°F | Ht 71.0 in | Wt 313.0 lb

## 2023-11-03 DIAGNOSIS — F319 Bipolar disorder, unspecified: Secondary | ICD-10-CM

## 2023-11-03 DIAGNOSIS — G4700 Insomnia, unspecified: Secondary | ICD-10-CM | POA: Diagnosis not present

## 2023-11-03 DIAGNOSIS — Z79899 Other long term (current) drug therapy: Secondary | ICD-10-CM

## 2023-11-03 DIAGNOSIS — R632 Polyphagia: Secondary | ICD-10-CM | POA: Diagnosis not present

## 2023-11-03 DIAGNOSIS — F1491 Cocaine use, unspecified, in remission: Secondary | ICD-10-CM | POA: Diagnosis not present

## 2023-11-03 MED ORDER — TOPIRAMATE 25 MG PO TABS: 25.0000 mg | ORAL_TABLET | Freq: Every day | ORAL | 1 refills | Status: DC

## 2023-11-03 NOTE — Patient Instructions (Signed)
 Continue bupropion 300 mg daily Continue ziprasidone 120 mg at night  Continue Carbamazepine 200 mg daily Start topiramate  25 mg at night  Continue Zolpidem 5 mg at night Obtain EKG - please call [phone]  to make an appointment  Obtain labs (CBC, CMP, carbamazepine, TSH, urine screening) Next appointment: 8/19 at 8:30,

## 2023-11-06 ENCOUNTER — Ambulatory Visit: Payer: Self-pay | Admitting: Psychiatry

## 2023-11-06 ENCOUNTER — Ambulatory Visit
Admission: RE | Admit: 2023-11-06 | Discharge: 2023-11-06 | Disposition: A | Discharge status: Home / Self Care | Source: Ambulatory Visit | Attending: Psychiatry | Admitting: Psychiatry

## 2023-11-06 DIAGNOSIS — I517 Cardiomegaly: Secondary | ICD-10-CM | POA: Diagnosis not present

## 2023-11-06 DIAGNOSIS — Z79899 Other long term (current) drug therapy: Secondary | ICD-10-CM | POA: Insufficient documentation

## 2023-11-06 NOTE — Progress Notes (Signed)
 Called patient to inform of EKG results and advise her to continue her medication as discussed she voiced understanding

## 2023-11-06 NOTE — Progress Notes (Signed)
 Please inform her that her EKG did not show any significant abnormalities. She is advised to continue taking her medication as discussed during her last visit.   EKG QTc 438 msec, HR 60,  NSR 10/2023

## 2023-11-13 ENCOUNTER — Other Ambulatory Visit: Payer: Self-pay | Admitting: Psychiatry

## 2023-11-13 ENCOUNTER — Telehealth: Payer: Self-pay

## 2023-11-13 MED ORDER — BUPROPION HCL ER (XL) 300 MG PO TB24: 300.0000 mg | ORAL_TABLET | Freq: Every day | ORAL | 0 refills | Status: DC

## 2023-11-13 NOTE — Telephone Encounter (Signed)
 Patient called to request a refill of buPROPion  (WELLBUTRIN  XL) 300 MG 24 hr tablet checking the chart I did not see Dr Vickey as the prescriber please advise   Last visit 11-03-23 Next visit 12-09-23    Preferred [facility] [address] [place]), [place] - [address] Phone: [phone]  Fax: [phone]

## 2023-11-13 NOTE — Telephone Encounter (Signed)
 Ordered

## 2023-11-25 ENCOUNTER — Ambulatory Visit: Payer: Self-pay | Admitting: Psychiatry

## 2023-11-25 LAB — MONITOR DRUG PROFILE 10(MW)
Amphetamine Scrn, Ur: NEGATIVE ng/mL
BARBITURATE SCREEN URINE: NEGATIVE ng/mL
BENZODIAZEPINE SCREEN, URINE: NEGATIVE ng/mL
CANNABINOIDS UR QL SCN: NEGATIVE ng/mL
Cocaine (Metab) Scrn, Ur: NEGATIVE ng/mL
Creatinine(Crt), U: 123.8 mg/dL (ref 20.0–300.0)
Methadone Screen, Urine: NEGATIVE ng/mL
OXYCODONE+OXYMORPHONE UR QL SCN: NEGATIVE ng/mL
Opiate Scrn, Ur: NEGATIVE ng/mL
Ph of Urine: 5.3 (ref 4.5–8.9)
Phencyclidine Qn, Ur: NEGATIVE ng/mL
Propoxyphene Scrn, Ur: NEGATIVE ng/mL

## 2023-11-25 LAB — CBC
Hematocrit: 42.6 % (ref 34.0–46.6)
Hemoglobin: 13.4 g/dL (ref 11.1–15.9)
MCH: 26.6 pg (ref 26.6–33.0)
MCHC: 31.5 g/dL (ref 31.5–35.7)
MCV: 85 fL (ref 79–97)
Platelets: 216 x10E3/uL (ref 150–450)
RBC: 5.03 x10E6/uL (ref 3.77–5.28)
RDW: 14.2 % (ref 11.7–15.4)
WBC: 5.5 x10E3/uL (ref 3.4–10.8)

## 2023-11-25 LAB — COMPREHENSIVE METABOLIC PANEL WITH GFR
ALT: 25 IU/L (ref 0–32)
AST: 21 IU/L (ref 0–40)
Albumin: 4.3 g/dL (ref 3.9–4.9)
Alkaline Phosphatase: 110 IU/L (ref 44–121)
BUN/Creatinine Ratio: 14 (ref 12–28)
BUN: 16 mg/dL (ref 8–27)
Bilirubin Total: 0.3 mg/dL (ref 0.0–1.2)
CO2: 21 mmol/L (ref 20–29)
Calcium: 10 mg/dL (ref 8.7–10.3)
Chloride: 104 mmol/L (ref 96–106)
Creatinine, Ser: 1.12 mg/dL — ABNORMAL HIGH (ref 0.57–1.00)
Globulin, Total: 3 g/dL (ref 1.5–4.5)
Glucose: 111 mg/dL — ABNORMAL HIGH (ref 70–99)
Potassium: 4.4 mmol/L (ref 3.5–5.2)
Sodium: 142 mmol/L (ref 134–144)
Total Protein: 7.3 g/dL (ref 6.0–8.5)
eGFR: 56 mL/min/1.73 — ABNORMAL LOW (ref >59)

## 2023-11-25 LAB — CARBAMAZEPINE LEVEL, TOTAL: Carbamazepine (Tegretol), S: 4.1 ug/mL (ref 4.0–12.0)

## 2023-11-25 NOTE — Progress Notes (Signed)
 Please advise the patient to continue her current medications as discussed. Her creatinine level is slightly above the normal range, and I would recommend that she follow up with her primary care provider for ongoing monitoring. Other labs, including liver function tests and carbamazepine  levels, are within acceptable range.

## 2023-11-26 ENCOUNTER — Telehealth: Payer: Self-pay | Admitting: Psychiatry

## 2023-11-26 ENCOUNTER — Other Ambulatory Visit: Payer: Self-pay | Admitting: Psychiatry

## 2023-11-26 MED ORDER — ZOLPIDEM TARTRATE 5 MG PO TABS: 5.0000 mg | ORAL_TABLET | Freq: Every evening | ORAL | 0 refills | Status: DC | PRN

## 2023-11-26 NOTE — Telephone Encounter (Signed)
 ordered

## 2023-11-26 NOTE — Progress Notes (Signed)
Called patient to inform of lab results she voiced understanding
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
## 2023-12-02 ENCOUNTER — Other Ambulatory Visit: Payer: Self-pay | Admitting: Psychiatry

## 2023-12-02 ENCOUNTER — Telehealth: Payer: Self-pay | Admitting: Psychiatry

## 2023-12-02 MED ORDER — ZIPRASIDONE HCL 60 MG PO CAPS: 120.0000 mg | ORAL_CAPSULE | Freq: Every day | ORAL | 0 refills | Status: DC

## 2023-12-02 NOTE — Telephone Encounter (Signed)
 Ordered

## 2023-12-02 NOTE — Telephone Encounter (Signed)
 Pt notified that rx was sent

## 2023-12-09 ENCOUNTER — Telehealth: Admitting: Psychiatry

## 2023-12-09 ENCOUNTER — Encounter: Payer: Self-pay | Admitting: Psychiatry

## 2023-12-09 DIAGNOSIS — R632 Polyphagia: Secondary | ICD-10-CM

## 2023-12-09 DIAGNOSIS — G4700 Insomnia, unspecified: Secondary | ICD-10-CM

## 2023-12-09 DIAGNOSIS — Z79899 Other long term (current) drug therapy: Secondary | ICD-10-CM

## 2023-12-09 DIAGNOSIS — F1491 Cocaine use, unspecified, in remission: Secondary | ICD-10-CM

## 2023-12-09 DIAGNOSIS — F319 Bipolar disorder, unspecified: Secondary | ICD-10-CM | POA: Diagnosis not present

## 2023-12-09 MED ORDER — ZIPRASIDONE HCL 60 MG PO CAPS: 120.0000 mg | ORAL_CAPSULE | Freq: Every day | ORAL | 1 refills | Status: DC

## 2023-12-09 MED ORDER — TOPIRAMATE 25 MG PO TABS: 25.0000 mg | ORAL_TABLET | Freq: Every day | ORAL | 1 refills | Status: DC

## 2023-12-09 MED ORDER — BUPROPION HCL ER (XL) 300 MG PO TB24: 300.0000 mg | ORAL_TABLET | Freq: Every day | ORAL | 5 refills | Status: AC

## 2023-12-09 MED ORDER — ZOLPIDEM TARTRATE 5 MG PO TABS: 5.0000 mg | ORAL_TABLET | Freq: Every evening | ORAL | 0 refills | Status: DC | PRN

## 2023-12-23 NOTE — Telephone Encounter (Signed)
 She should have both supply of medication to be filled. Please verify the order with the pharmacy and update the patient accordingly.

## 2023-12-23 NOTE — Telephone Encounter (Signed)
 pt was notified that i tried to call pharmacy 3 times with no answer so i had to leave a message. but that the pharmacy shoud have the rx on file and for her to check with pharmacy later today. she agreed

## 2023-12-23 NOTE — Telephone Encounter (Signed)
 tried to call and speak with someone at the pharamcy 3 time and finally had to leave a voice mail. explained that pt should have both rx on file with a posted dated date on both medications.

## 2024-01-23 ENCOUNTER — Telehealth: Payer: Self-pay | Admitting: Psychiatry

## 2024-01-23 ENCOUNTER — Other Ambulatory Visit: Payer: Self-pay | Admitting: Psychiatry

## 2024-01-23 MED ORDER — ZOLPIDEM TARTRATE 5 MG PO TABS: 5.0000 mg | ORAL_TABLET | Freq: Every evening | ORAL | 0 refills | Status: DC | PRN

## 2024-01-23 NOTE — Telephone Encounter (Signed)
 Patient calls stating she need a a refill on Ambien . She has no refills left. Please send to [facility] [address].

## 2024-01-23 NOTE — Telephone Encounter (Signed)
 Medication is ordered to start on 10/5.
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
## 2024-02-04 ENCOUNTER — Telehealth (INDEPENDENT_AMBULATORY_CARE_PROVIDER_SITE_OTHER): Admitting: Psychiatry

## 2024-02-04 ENCOUNTER — Encounter: Payer: Self-pay | Admitting: Psychiatry

## 2024-02-04 DIAGNOSIS — G4700 Insomnia, unspecified: Secondary | ICD-10-CM

## 2024-02-04 DIAGNOSIS — R632 Polyphagia: Secondary | ICD-10-CM

## 2024-02-04 DIAGNOSIS — Z79899 Other long term (current) drug therapy: Secondary | ICD-10-CM | POA: Diagnosis not present

## 2024-02-04 DIAGNOSIS — F319 Bipolar disorder, unspecified: Secondary | ICD-10-CM | POA: Diagnosis not present

## 2024-02-04 MED ORDER — ZOLPIDEM TARTRATE 5 MG PO TABS: 5.0000 mg | ORAL_TABLET | Freq: Every evening | ORAL | 2 refills | Status: DC | PRN

## 2024-02-04 MED ORDER — TOPIRAMATE 25 MG PO TABS: 25.0000 mg | ORAL_TABLET | Freq: Every day | ORAL | 5 refills | Status: AC

## 2024-02-04 NOTE — Patient Instructions (Signed)
 Continue bupropion  300 mg daily Continue ziprasidone  120 mg at night  Continue Carbamazepine  200 mg daily Continue topiramate  25 mg at night  Continue Zolpidem  5 mg at night Next appointment: 1/5 at 8:30

## 2024-02-23 ENCOUNTER — Telehealth: Payer: Self-pay | Admitting: Psychiatry

## 2024-02-23 NOTE — Telephone Encounter (Signed)
 She has zolpidem  scheduled for pickup per Dr. Vickey tomorrow.  She needs to contact pharmacy.  For Tegretol -I am unable to review in the medical records as to when she was last prescribed this medication by Dr. Vickey. Will have CMA contact pharmacy to clarify the dosage, the last time it was written and if she does have any refills pending.  Once that is confirmed we will consider sending a refill to pharmacy.

## 2024-02-24 ENCOUNTER — Other Ambulatory Visit: Payer: Self-pay | Admitting: Psychiatry

## 2024-02-24 MED ORDER — CARBAMAZEPINE 200 MG PO TABS: 200.0000 mg | ORAL_TABLET | Freq: Every day | ORAL | 0 refills | Status: DC

## 2024-02-24 NOTE — Telephone Encounter (Signed)
 The order has been placed. Please verify with her that she is taking it daily, as the refill is not yet due.

## 2024-02-24 NOTE — Telephone Encounter (Signed)
 I agree with the above comment. Please follow up on this.

## 2024-02-24 NOTE — Telephone Encounter (Signed)
 Called patients pharmacy spoke to [facility] she stated the patients Tegretol  is 200 mg last filled on 12-04-23 for 90 tablets no refills on file last filled by Dr Daniel Lauth

## 2024-02-26 NOTE — Telephone Encounter (Signed)
 Patient did received the medication and advised patient to take medication as prescribed patient voiced understanding

## 2024-03-05 ENCOUNTER — Telehealth: Payer: Self-pay | Admitting: Psychiatry

## 2024-03-05 NOTE — Telephone Encounter (Signed)
 She should have enough refill through next March. Please call the pharmacy, verify this, and update the patient. Thanks.

## 2024-03-08 ENCOUNTER — Other Ambulatory Visit: Payer: Self-pay | Admitting: Psychiatry

## 2024-03-08 ENCOUNTER — Telehealth: Payer: Self-pay

## 2024-03-08 MED ORDER — ZIPRASIDONE HCL 60 MG PO CAPS: 120.0000 mg | ORAL_CAPSULE | Freq: Every day | ORAL | 1 refills | Status: DC

## 2024-03-08 NOTE — Telephone Encounter (Signed)
 Ordered

## 2024-03-08 NOTE — Telephone Encounter (Signed)
 Called the patients previous pharmacy [facility] and spoke to [facility] she stated that the patient has not had any medication filled and delivered cancelled all remaining refills she is currently using [facility] pharmacy please send refill for Geodon . Please advise

## 2024-03-08 NOTE — Telephone Encounter (Signed)
 Medication refill - Fax from patient's [facility] requesting a new Geodon  order, last provided 01/01/24 + 1 refill. Patient last seen 02/04/24 and returns next on 04/26/24.
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
## 2024-04-26 ENCOUNTER — Ambulatory Visit (INDEPENDENT_AMBULATORY_CARE_PROVIDER_SITE_OTHER): Admitting: Psychiatry

## 2024-04-26 ENCOUNTER — Encounter: Payer: Self-pay | Admitting: Psychiatry

## 2024-04-26 VITALS — BP 132/82 | HR 76 | Temp 97.7°F | Ht 70.0 in | Wt 309.0 lb

## 2024-04-26 DIAGNOSIS — F319 Bipolar disorder, unspecified: Secondary | ICD-10-CM

## 2024-04-26 DIAGNOSIS — R632 Polyphagia: Secondary | ICD-10-CM

## 2024-04-26 DIAGNOSIS — G4700 Insomnia, unspecified: Secondary | ICD-10-CM | POA: Diagnosis not present

## 2024-04-26 MED ORDER — ZOLPIDEM TARTRATE 5 MG PO TABS: 5.0000 mg | ORAL_TABLET | Freq: Every evening | ORAL | 1 refills | Status: AC | PRN

## 2024-04-26 MED ORDER — ZIPRASIDONE HCL 60 MG PO CAPS: 120.0000 mg | ORAL_CAPSULE | Freq: Every day | ORAL | 1 refills | Status: AC

## 2024-04-26 MED ORDER — CARBAMAZEPINE 200 MG PO TABS: 200.0000 mg | ORAL_TABLET | Freq: Every day | ORAL | 0 refills | Status: AC

## 2024-04-26 NOTE — Patient Instructions (Signed)
 Continue bupropion  300 mg daily Continue ziprasidone  120 mg at night  Continue Carbamazepine  200 mg daily Hold topiramate   Continue Zolpidem  5 mg at night Next appointment: 3/19 at 8 AM

## 2024-05-20 ENCOUNTER — Other Ambulatory Visit: Payer: Self-pay | Admitting: Psychiatry

## 2024-05-20 ENCOUNTER — Telehealth: Payer: Self-pay | Admitting: Psychiatry

## 2024-05-20 MED ORDER — ZOLPIDEM TARTRATE 5 MG PO TABS: 5.0000 mg | ORAL_TABLET | Freq: Every evening | ORAL | 1 refills | Status: AC | PRN

## 2024-05-20 NOTE — Telephone Encounter (Signed)
 Per pharmacist not able to refill sooner than 05/24/24 unless new rx is written to refill sooner as medication is a controlled substance. Pharmacist stated start day would have to be changed on new rx

## 2024-05-20 NOTE — Telephone Encounter (Signed)
 The order was already sent to start on 2/2. Due to the weather, please contact the pharmacy today to request an earlier refill, and update the patient. thanks

## 2024-05-20 NOTE — Telephone Encounter (Signed)
 I sent a new order.  Please notify the patient regarding the information above. Please also inform her that after this refill, the next refill is to be started on 3/4.

## 2024-05-20 NOTE — Telephone Encounter (Signed)
Patient confirmed and verbalized understanding.  

## 2024-07-08 ENCOUNTER — Ambulatory Visit: Admitting: Psychiatry
